# Patient Record
Sex: Female | Born: 1995 | Hispanic: No | Marital: Single | Smoking: Former smoker
Health system: Southern US, Community
[De-identification: ages and names within clinical notes are randomized; demographics above are authoritative.]

## PROBLEM LIST (undated history)

## (undated) DIAGNOSIS — T7840XA Allergy, unspecified, initial encounter: Secondary | ICD-10-CM

## (undated) DIAGNOSIS — S060XAA Concussion with loss of consciousness status unknown, initial encounter: Secondary | ICD-10-CM

## (undated) HISTORY — DX: Allergy, unspecified, initial encounter: T78.40XA

## (undated) HISTORY — PX: NO PAST SURGERIES: SHX2092

## (undated) HISTORY — DX: Concussion with loss of consciousness status unknown, initial encounter: S06.0XAA

---

## 2019-07-30 ENCOUNTER — Other Ambulatory Visit: Payer: Self-pay

## 2019-07-30 ENCOUNTER — Encounter: Payer: Self-pay | Admitting: Family Medicine

## 2019-07-30 ENCOUNTER — Ambulatory Visit (INDEPENDENT_AMBULATORY_CARE_PROVIDER_SITE_OTHER): Payer: 59 | Admitting: Family Medicine

## 2019-07-30 VITALS — BP 104/76 | HR 66 | Temp 97.9°F | Ht 64.5 in | Wt 213.2 lb

## 2019-07-30 DIAGNOSIS — G4700 Insomnia, unspecified: Secondary | ICD-10-CM | POA: Insufficient documentation

## 2019-07-30 DIAGNOSIS — F411 Generalized anxiety disorder: Secondary | ICD-10-CM | POA: Diagnosis not present

## 2019-07-30 NOTE — Patient Instructions (Addendum)
#  Insomnia - See sleep check list - Consider - black out curtains, ear plugs - ZzzQuil - for sleep aid     Sleep hygiene checklist: 1. Avoid naps during the day 2. Avoid stimulants such as caffeine and nicotine. Avoid bedtime alcohol (it can speed onset of sleep but the body's metabolism can cause awakenings). At least 2 hours before bedtime 3. All forms of exercise help ensure sound sleep - limit vigorous exercise to morning or late afternoon 4. Avoid food too close to bedtime including chocolate (which contains caffeine) 5. Soak up natural light 6. Establish regular bedtime routine. 7. Associate bed with sleep - avoid TV, computer or phone, reading while in bed. 8. Ensure pleasant, relaxing sleep environment - quiet, dark, cool room.  Good Sleep Hygiene Habits -- Got to bed and wake up within an hour of the same time every day -- Avoid bright screens (from laptop, phone, TV) within at least 30 minutes before bed. The "blue light" supresses the sleep hormone melatonin and the content may stimulate as well -- Maintain a quiet and dark sleep environment (blackout curtains, turn on a fan or white noise to block out disruptive sounds) -- Practicing relaxing activites before bed (taking a shower, reading a book, journaling, meditation app) -- To quiet a busy mind -- consider journaling before bed (jotting down reminders, worry thoughts, as well as positive things like a gratitude list)   Begin a Mindfulness/Meditation practice -- this can take a little as 3 minutes -- You can find resources in books -- Or you can download apps like  ---- Headspace App (which currently has free content called "Weathering the Storm") ---- Calm (which has a few free options)  ---- Insignt Timer ---- Stop, Breathe & Think  # With each of these Apps - you should decline the "start free trial" offer and as you search through the App should be able to access some of their free content. You can also chose to pay  for the content if you find one that works well for you.   # Many of them also offer sleep specific content which may help with insomnia   How to help anxiety - without medication.   1) Regular Exercise - walking, jogging, cycling, dancing, strength training  2)  Begin a Mindfulness/Meditation practice -- see above  3) Healthy Diet -- Avoid or decrease Caffeine -- Avoid or decrease Alcohol -- Drink plenty of water, have a balanced diet -- Avoid cigarettes and marijuana (as well as other recreational drugs)  4) Consider contacting a professional therapist   Psychologytoday.[email or web address]  Covid Testing Location options:   [place] options:  [place]  [facility] Hotline to find locations near you: [phone]  Possible Rapid Testing:  [facility] [place], [place]  Phone: [phone] *Check on cost, may be expensive  Home Kits: [email or web address] Available via [facility]. The ship the kit to you and you send it back in the envelop provided. This does take fedex mail time but should get results once received within a few days.   Covid testing is covered 100% by insurance and should also be covered 100% for high deductible plans

## 2019-07-30 NOTE — Assessment & Plan Note (Signed)
Insomnia primary complaint. Would like to do a trial of therapy and non-medication approach and return in 1-2 months to check in.

## 2019-07-30 NOTE — Progress Notes (Signed)
Subjective:     Rose Glenn is a 23 y.o. female presenting for Establish Care (was seen provider on college campus at [facility] [employer]) and Insomnia (having hard time staying asleep, x 1 month)     HPI   #Insomnia - over the last month - going to sleep ok - waking up over the night, frequently every 1-2 hours - wakes up for 15-30 minutes each - sleep is not restorative - tried melatonin w/o improvement -- did 2 weeks  - nothing she can think of that triggers waking up - stress: high - work stress - stress treatment: goes outside - no hx of anxiety or depression - does feel like she has anxiety - Caffeine: stopped coffee 2 months ago, drinking 3 cups of green tea -- does have one after lunch - endorses snoring - partner does not say she stops breathing - no falling sleep during day or while relaxing    Review of Systems  Constitutional: Negative for chills and fever.  Psychiatric/Behavioral: Positive for sleep disturbance. The patient is nervous/anxious.      Social History   Tobacco Use  Smoking Status Never Smoker  Smokeless Tobacco Never Used        Objective:    BP Readings from Last 3 Encounters:  07/30/19 104/76   Wt Readings from Last 3 Encounters:  07/30/19 213 lb 4 oz (96.7 kg)    BP 104/76   Pulse 66   Temp 97.9 F (36.6 C)   Ht 5' 4.5" (1.638 m)   Wt 213 lb 4 oz (96.7 kg)   LMP 07/03/2019   SpO2 99%   BMI 36.04 kg/m    Physical Exam Constitutional:      General: She is not in acute distress.    Appearance: She is well-developed. She is not diaphoretic.  HENT:     Right Ear: External ear normal.     Left Ear: External ear normal.     Nose: Nose normal.  Eyes:     Conjunctiva/sclera: Conjunctivae normal.  Neck:     Musculoskeletal: Neck supple.  Cardiovascular:     Rate and Rhythm: Normal rate.  Pulmonary:     Effort: Pulmonary effort is normal.  Skin:    General: Skin is warm and dry.     Capillary Refill: Capillary refill takes  less than 2 seconds.  Neurological:     Mental Status: She is alert. Mental status is at baseline.  Psychiatric:        Mood and Affect: Mood normal.        Behavior: Behavior normal.      Depression screen PHQ 2/9 07/30/2019  Decreased Interest 1  Down, Depressed, Hopeless 1  PHQ - 2 Score 2  Altered sleeping 3  Tired, decreased energy 2  Change in appetite 1  Feeling bad or failure about yourself  2  Trouble concentrating 3  Moving slowly or fidgety/restless 0  Suicidal thoughts 0  PHQ-9 Score 13  Difficult doing work/chores Somewhat difficult        Assessment & Plan:   Problem List Items Addressed This Visit      Other   Generalized anxiety disorder    Insomnia primary complaint. Would like to do a trial of therapy and non-medication approach and return in 1-2 months to check in.       Insomnia - Primary    Difficulty staying asleep. Discussed treatment of life stressors as well as trial of OTC zzzquil and  using black out curtains/ear plugs          Return in about 6 weeks (around 09/10/2019).  Lynnda Child, MD

## 2019-07-30 NOTE — Assessment & Plan Note (Signed)
Difficulty staying asleep. Discussed treatment of life stressors as well as trial of OTC zzzquil and using black out curtains/ear plugs

## 2019-07-31 ENCOUNTER — Telehealth: Payer: Self-pay | Admitting: Family Medicine

## 2019-07-31 NOTE — Telephone Encounter (Signed)
Rec'd from [employer] forwarded 3 pages to Dr. Waunita Schooner

## 2019-10-10 ENCOUNTER — Ambulatory Visit: Payer: 59 | Admitting: Family Medicine

## 2019-10-10 ENCOUNTER — Encounter: Payer: Self-pay | Admitting: Family Medicine

## 2019-10-10 ENCOUNTER — Other Ambulatory Visit: Payer: Self-pay

## 2019-10-10 ENCOUNTER — Ambulatory Visit (INDEPENDENT_AMBULATORY_CARE_PROVIDER_SITE_OTHER): Payer: No Typology Code available for payment source | Admitting: Family Medicine

## 2019-10-10 ENCOUNTER — Other Ambulatory Visit (HOSPITAL_COMMUNITY)
Admission: RE | Admit: 2019-10-10 | Discharge: 2019-10-10 | Disposition: A | Discharge status: Home / Self Care | Payer: No Typology Code available for payment source | Source: Ambulatory Visit | Attending: Family Medicine | Admitting: Family Medicine

## 2019-10-10 VITALS — BP 102/70 | HR 76 | Temp 97.3°F | Ht 65.5 in | Wt 214.8 lb

## 2019-10-10 DIAGNOSIS — Z113 Encounter for screening for infections with a predominantly sexual mode of transmission: Secondary | ICD-10-CM

## 2019-10-10 DIAGNOSIS — Z124 Encounter for screening for malignant neoplasm of cervix: Secondary | ICD-10-CM | POA: Insufficient documentation

## 2019-10-10 DIAGNOSIS — R300 Dysuria: Secondary | ICD-10-CM

## 2019-10-10 LAB — POC URINALSYSI DIPSTICK (AUTOMATED)
Bilirubin, UA: NEGATIVE
Glucose, UA: NEGATIVE
Ketones, UA: NEGATIVE
Leukocytes, UA: NEGATIVE
Nitrite, UA: NEGATIVE
Protein, UA: POSITIVE — AB
Spec Grav, UA: >=1.03 — AB (ref 1.010–1.025)
Urobilinogen, UA: 0.2 E.U./dL
pH, UA: 6 (ref 5.0–8.0)

## 2019-10-10 LAB — POCT URINALYSIS DIPSTICK OB
Glucose, UA: NEGATIVE
Leukocytes, UA: NEGATIVE
Spec Grav, UA: 1.01 (ref 1.010–1.025)
Urobilinogen, UA: 0.2 E.U./dL
pH, UA: 6 (ref 5.0–8.0)

## 2019-10-10 NOTE — Addendum Note (Signed)
Addended by: Milus Mallick on: 10/10/2019 10:13 AM   Modules accepted: Orders

## 2019-10-10 NOTE — Addendum Note (Signed)
Addended by: Aquilla Solian on: 10/10/2019 11:10 AM   Modules accepted: Orders

## 2019-10-10 NOTE — Progress Notes (Signed)
Subjective:    Patient ID: Rose Glenn, female    DOB: Feb 20, 1996, 25 y.o.   MRN: [id number]  HPI Chief Complaint  Patient presents with  . Urinary Tract Infection   This is a 24 yo female who presents today with slight discomfort in vagina. She has had recent same sex sexual activity. Three life time partners. She has previously had sex with men, most recently with women. No burning with urination. No frequency. No discharge or bleeding. She did visit her sister recently and used different soap. No previous screening for STD or pap.    Past Medical History:  Diagnosis Date  . Allergy    Past Surgical History:  Procedure Laterality Date  . NO PAST SURGERIES     Family History  Problem Relation Age of Onset  . Arthritis Mother   . Hypertension Maternal Grandmother   . Hyperlipidemia Maternal Grandmother   . Heart disease Maternal Grandmother        had surgery  . Hypertension Maternal Grandfather   . Hyperlipidemia Maternal Grandfather   . Diabetes Paternal Grandmother   . Hypertension Paternal Grandmother   . Hyperlipidemia Paternal Grandmother   . Hypertension Paternal Grandfather   . Hyperlipidemia Paternal Grandfather    Social History   Tobacco Use  . Smoking status: Never Smoker  . Smokeless tobacco: Never Used  Substance Use Topics  . Alcohol use: Yes    Comment: on the weekends, 1 drink at a time  . Drug use: Not Currently    Types: Marijuana    Comment: stopped 2019      Review of Systems Per HPI    Objective:   Physical Exam Vitals reviewed. Exam conducted with a chaperone present.  Constitutional:      General: She is not in acute distress.    Appearance: Normal appearance. She is obese. She is not ill-appearing, toxic-appearing or diaphoretic.  Cardiovascular:     Rate and Rhythm: Normal rate.  Pulmonary:     Effort: Pulmonary effort is normal.  Genitourinary:    Exam position: Supine.     Pubic Area: No rash or pubic lice.      Labia:         Right: No rash, tenderness, lesion or injury.        Left: No rash, tenderness, lesion or injury.      Urethra: No prolapse, urethral pain, urethral swelling or urethral lesion.     Vagina: Normal.     Cervix: Normal.  Skin:    General: Skin is warm and dry.  Neurological:     Mental Status: She is alert and oriented to person, place, and time.       BP 102/70   Pulse 76   Temp (!) 97.3 F (36.3 C)   Ht 5' 5.5" (1.664 m)   Wt 214 lb 12.8 oz (97.4 kg)   SpO2 99%   BMI 35.20 kg/m  Wt Readings from Last 3 Encounters:  10/10/19 214 lb 12.8 oz (97.4 kg)  07/30/19 213 lb 4 oz (96.7 kg)   Results for orders placed or performed in visit on 10/10/19  POCT Urinalysis Dipstick (Automated)  Result Value Ref Range   Color, UA Yellow    Clarity, UA Clear    Glucose, UA Negative Negative   Bilirubin, UA Negative    Ketones, UA Negative    Spec Grav, UA >=1.030 (A) 1.010 - 1.025   Blood, UA Moderate    pH, UA 6.0  5.0 - 8.0   Protein, UA Positive (A) Negative   Urobilinogen, UA 0.2 0.2 or 1.0 E.U./dL   Nitrite, UA Negative    Leukocytes, UA Negative Negative       Assessment & Plan:  1. Screening for cervical cancer - Cytology - PAP([facility])  2. Screening for STD (sexually transmitted disease) - Cytology - PAP([facility]) - RPR - HIV antibody  3. Dysuria - She is describing more perineal discomfort which may be related to new soap. No localized irritation visualized. Encouraged increased fluids.  - POCT Urinalysis Dipstick (Automated)  This visit occurred during the SARS-CoV-2 public health emergency.  Safety protocols were in place, including screening questions prior to the visit, additional usage of staff PPE, and extensive cleaning of exam room while observing appropriate contact time as indicated for disinfecting solutions.     Clarene Reamer, FNP-BC  [facility]  10/10/2019 9:47 AM

## 2019-10-10 NOTE — Patient Instructions (Signed)
Good to meet you today  I will call you with lab results. Please consider signing up for your Mychart account.   Increase fluids over the weekend.

## 2019-10-13 ENCOUNTER — Other Ambulatory Visit: Payer: Self-pay

## 2019-10-13 ENCOUNTER — Other Ambulatory Visit (INDEPENDENT_AMBULATORY_CARE_PROVIDER_SITE_OTHER): Payer: No Typology Code available for payment source

## 2019-10-13 DIAGNOSIS — Z113 Encounter for screening for infections with a predominantly sexual mode of transmission: Secondary | ICD-10-CM

## 2019-10-13 DIAGNOSIS — Z124 Encounter for screening for malignant neoplasm of cervix: Secondary | ICD-10-CM

## 2019-10-13 DIAGNOSIS — R300 Dysuria: Secondary | ICD-10-CM

## 2019-10-14 LAB — CYTOLOGY - PAP
Adequacy: ABSENT
Chlamydia: NEGATIVE
Comment: NEGATIVE
Comment: NEGATIVE
Comment: NORMAL
Diagnosis: NEGATIVE
Neisseria Gonorrhea: NEGATIVE
Trichomonas: NEGATIVE

## 2019-10-14 LAB — HIV ANTIBODY (ROUTINE TESTING W REFLEX): HIV 1&2 Ab, 4th Generation: NONREACTIVE

## 2019-10-14 LAB — RPR: RPR Ser Ql: NONREACTIVE

## 2019-10-30 ENCOUNTER — Ambulatory Visit (INDEPENDENT_AMBULATORY_CARE_PROVIDER_SITE_OTHER)
Admission: RE | Admit: 2019-10-30 | Discharge: 2019-10-30 | Disposition: A | Discharge status: Home / Self Care | Payer: No Typology Code available for payment source | Source: Ambulatory Visit | Attending: Internal Medicine | Admitting: Internal Medicine

## 2019-10-30 ENCOUNTER — Encounter: Payer: Self-pay | Admitting: Internal Medicine

## 2019-10-30 ENCOUNTER — Other Ambulatory Visit: Payer: Self-pay

## 2019-10-30 ENCOUNTER — Ambulatory Visit (INDEPENDENT_AMBULATORY_CARE_PROVIDER_SITE_OTHER): Payer: No Typology Code available for payment source | Admitting: Internal Medicine

## 2019-10-30 VITALS — BP 106/68 | HR 73 | Temp 97.7°F | Wt 214.0 lb

## 2019-10-30 DIAGNOSIS — M79641 Pain in right hand: Secondary | ICD-10-CM | POA: Diagnosis not present
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
## 2019-10-30 NOTE — Patient Instructions (Signed)
RICE Therapy for Routine Care of Injuries Many injuries can be cared for with rest, ice, compression, and elevation (RICE therapy). This includes:  Resting the injured part.  Putting ice on the injury.  Putting pressure (compression) on the injury.  Raising the injured part (elevation). Using RICE therapy can help to lessen pain and swelling. Supplies needed:  Ice.  Plastic bag.  Towel.  Elastic bandage.  Pillow or pillows to raise (elevate) your injured body part. How to care for your injury with RICE therapy Rest Limit your normal activities, and try not to use the injured part of your body. You can go back to your normal activities when your doctor says it is okay to do them and you feel okay. Ask your doctor if you should do exercises to help your injury get better. Ice Put ice on the injured area. Do not put ice on your bare skin.  Put ice in a plastic bag.  Place a towel between your skin and the bag.  Leave the ice on for 20 minutes, 2-3 times a day. Use ice on as many days as told by your doctor.  Compression Compression means putting pressure on the injured area. This can be done with an elastic bandage. If an elastic bandage has been put on your injury:  Do not wrap the bandage too tight. Wrap the bandage more loosely if part of your body away from the bandage is blue, swollen, cold, painful, or loses feeling (gets numb).  Take off the bandage and put it on again. Do this every 3-4 hours or as told by your doctor.  See your doctor if the bandage seems to make your problems worse.  Elevation Elevation means keeping the injured area raised. If you can, raise the injured area above your heart or the center of your chest. Contact a doctor if:  You keep having pain and swelling.  Your symptoms get worse. Get help right away if:  You have sudden bad pain at your injury or lower than your injury.  You have redness or more swelling around your injury.  You  have tingling or numbness at your injury or lower than your injury, and it does not go away when you take off the bandage. Summary  Many injuries can be cared for using rest, ice, compression, and elevation (RICE therapy).  You can go back to your normal activities when you feel okay and your doctor says it is okay.  Put ice on the injured area as told by your doctor.  Get help if your symptoms get worse or if you keep having pain and swelling. This information is not intended to replace advice given to you by your health care provider. Make sure you discuss any questions you have with your health care provider. Document Revised: 06/01/2017 Document Reviewed: 06/01/2017 Elsevier Patient Education  2020 Elsevier Inc.  

## 2019-12-11 ENCOUNTER — Ambulatory Visit: Payer: 59 | Attending: Family

## 2019-12-11 DIAGNOSIS — Z23 Encounter for immunization: Secondary | ICD-10-CM

## 2019-12-11 NOTE — Progress Notes (Signed)
   Covid-19 Vaccination Clinic  Name:  Rose Glenn    MRN: [id number] DOB: May 20, 1996  12/11/2019  Rose Glenn was observed post Covid-19 immunization for 15 minutes without incident. She was provided with Vaccine Information Sheet and instruction to access the V-Safe system.   Rose Glenn was instructed to call 911 with any severe reactions post vaccine: Marland Kitchen Difficulty breathing  . Swelling of face and throat  . A fast heartbeat  . A bad rash all over body  . Dizziness and weakness   Immunizations Administered    Name Date Dose VIS Date Route   Moderna COVID-19 Vaccine 12/11/2019  4:16 PM 0.5 mL 08/26/2019 Intramuscular   Manufacturer: Moderna   Lot: [id number]   NDC: 10175-102-58

## 2020-01-15 ENCOUNTER — Ambulatory Visit: Payer: 59 | Attending: Family

## 2020-01-15 DIAGNOSIS — Z23 Encounter for immunization: Secondary | ICD-10-CM

## 2020-01-15 NOTE — Progress Notes (Signed)
   Covid-19 Vaccination Clinic  Name:  Rose Glenn    MRN: [id number] DOB: 12/07/1995  01/15/2020  [name] was observed post Covid-19 immunization for 15 minutes without incident. She was provided with Vaccine Information Sheet and instruction to access the V-Safe system.   Ms. Stump was instructed to call 911 with any severe reactions post vaccine: Marland Kitchen Difficulty breathing  . Swelling of face and throat  . A fast heartbeat  . A bad rash all over body  . Dizziness and weakness   Immunizations Administered    Name Date Dose VIS Date Route   Moderna COVID-19 Vaccine 01/15/2020  3:59 PM 0.5 mL 08/2019 Intramuscular   Manufacturer: Moderna   Lot: [id number]   NDC: 65035-465-68

## 2020-02-24 ENCOUNTER — Encounter: Payer: Self-pay | Admitting: Family Medicine

## 2020-02-24 ENCOUNTER — Other Ambulatory Visit: Payer: Self-pay

## 2020-02-24 ENCOUNTER — Ambulatory Visit (INDEPENDENT_AMBULATORY_CARE_PROVIDER_SITE_OTHER): Payer: No Typology Code available for payment source | Admitting: Family Medicine

## 2020-02-24 VITALS — BP 104/70 | HR 78 | Ht 65.5 in | Wt 213.0 lb

## 2020-02-24 DIAGNOSIS — B373 Candidiasis of vulva and vagina: Secondary | ICD-10-CM

## 2020-02-24 DIAGNOSIS — B3731 Acute candidiasis of vulva and vagina: Secondary | ICD-10-CM | POA: Insufficient documentation

## 2020-02-24 LAB — POCT WET PREP (WET MOUNT): Trichomonas Wet Prep HPF POC: ABSENT

## 2020-02-24 MED ORDER — FLUCONAZOLE 150 MG PO TABS: ORAL_TABLET | ORAL | 0 refills | Status: DC

## 2020-02-24 NOTE — Progress Notes (Signed)
Chief Complaint  Patient presents with  . Vaginitis    History of Present Illness: HPI    24 year old female patient of Dr. Elmyra Glenn present with new onset vaginal discharge, itching ongoign x 6 days   She treated with monistat.. it help mitigate some of the discharge, but has not resolved completely. Now symptoms off and on. Monistat caused cramping.  no sores.   No past vaginal yeast or BV in past.  No dysuria, no D/C/ N/V, no abdominal pain.    She is sexually active.. no new partner. No recent antibiotics.  No hx of STDs.   This visit occurred during the SARS-CoV-2 public health emergency.  Safety protocols were in place, including screening questions prior to the visit, additional usage of staff PPE, and extensive cleaning of exam room while observing appropriate contact time as indicated for disinfecting solutions.   COVID 19 screen:  No recent travel or known exposure to COVID19 The patient denies respiratory symptoms of COVID 19 at this time. The importance of social distancing was discussed today.     Review of Systems  Constitutional: Negative for chills and fever.  HENT: Negative for congestion and ear pain.   Eyes: Negative for pain and redness.  Respiratory: Negative for cough and shortness of breath.   Cardiovascular: Negative for chest pain, palpitations and leg swelling.  Gastrointestinal: Negative for abdominal pain, blood in stool, constipation, diarrhea, nausea and vomiting.  Genitourinary: Negative for dysuria.  Musculoskeletal: Negative for falls and myalgias.  Skin: Negative for rash.  Neurological: Negative for dizziness.  Psychiatric/Behavioral: Negative for depression. The patient is not nervous/anxious.       Past Medical History:  Diagnosis Date  . Allergy     reports that she has never smoked. She has never used smokeless tobacco. She reports current alcohol use. She reports previous drug use. Drug: Marijuana.   Current Outpatient  Medications:  .  ADDERALL XR 20 MG 24 hr capsule, Take 20 mg by mouth at bedtime., Disp: , Rfl:  .  cetirizine (ZYRTEC) 10 MG tablet, Take 10 mg by mouth daily., Disp: , Rfl:    Observations/Objective: Blood pressure 104/70, pulse 78, height 5' 5.5" (1.664 m), weight 213 lb (96.6 kg), last menstrual period 02/01/2020, SpO2 98 %.  Physical Exam Constitutional:      General: She is not in acute distress.    Appearance: Normal appearance. She is well-developed. She is not ill-appearing or toxic-appearing.  HENT:     Head: Normocephalic.     Right Ear: Hearing, tympanic membrane, ear canal and external ear normal. Tympanic membrane is not erythematous, retracted or bulging.     Left Ear: Hearing, tympanic membrane, ear canal and external ear normal. Tympanic membrane is not erythematous, retracted or bulging.     Nose: No mucosal edema or rhinorrhea.     Right Sinus: No maxillary sinus tenderness or frontal sinus tenderness.     Left Sinus: No maxillary sinus tenderness or frontal sinus tenderness.     Mouth/Throat:     Pharynx: Uvula midline.  Eyes:     General: Lids are normal. Lids are everted, no foreign bodies appreciated.     Conjunctiva/sclera: Conjunctivae normal.     Pupils: Pupils are equal, round, and reactive to light.  Neck:     Thyroid: No thyroid mass or thyromegaly.     Vascular: No carotid bruit.     Trachea: Trachea normal.  Cardiovascular:     Rate and Rhythm: Normal  rate and regular rhythm.     Pulses: Normal pulses.     Heart sounds: Normal heart sounds, S1 normal and S2 normal. No murmur. No friction rub. No gallop.   Pulmonary:     Effort: Pulmonary effort is normal. No tachypnea or respiratory distress.     Breath sounds: Normal breath sounds. No decreased breath sounds, wheezing, rhonchi or rales.  Abdominal:     General: Bowel sounds are normal.     Palpations: Abdomen is soft.     Tenderness: There is no abdominal tenderness.  Genitourinary:    Vagina:  Vaginal discharge and erythema present.  Musculoskeletal:     Cervical back: Normal range of motion and neck supple.  Skin:    General: Skin is warm and dry.     Findings: No rash.  Neurological:     Mental Status: She is alert.  Psychiatric:        Mood and Affect: Mood is not anxious or depressed.        Speech: Speech normal.        Behavior: Behavior normal. Behavior is cooperative.        Thought Content: Thought content normal.        Judgment: Judgment normal.      Assessment and Plan   Vagina, candidiasis Treat with fluconazole x 1 dose, may repeat in 3 days if needed.     Eliezer Lofts, MD

## 2020-02-24 NOTE — Patient Instructions (Signed)
Vaginal Yeast Infection, Adult  Vaginal yeast infection is a condition that causes vaginal discharge as well as soreness, swelling, and redness (inflammation) of the vagina. This is a common condition. Some women get this infection frequently. What are the causes? This condition is caused by a change in the normal balance of the yeast (candida) and bacteria that live in the vagina. This change causes an overgrowth of yeast, which causes the inflammation. What increases the risk? The condition is more likely to develop in women who:  Take antibiotic medicines.  Have diabetes.  Take birth control pills.  Are pregnant.  Douche often.  Have a weak body defense system (immune system).  Have been taking steroid medicines for a long time.  Frequently wear tight clothing. What are the signs or symptoms? Symptoms of this condition include:  White, thick, creamy vaginal discharge.  Swelling, itching, redness, and irritation of the vagina. The lips of the vagina (vulva) may be affected as well.  Pain or a burning feeling while urinating.  Pain during sex. How is this diagnosed? This condition is diagnosed based on:  Your medical history.  A physical exam.  A pelvic exam. Your health care provider will examine a sample of your vaginal discharge under a microscope. Your health care provider may send this sample for testing to confirm the diagnosis. How is this treated? This condition is treated with medicine. Medicines may be over-the-counter or prescription. You may be told to use one or more of the following:  Medicine that is taken by mouth (orally).  Medicine that is applied as a cream (topically).  Medicine that is inserted directly into the vagina (suppository). Follow these instructions at home:  Lifestyle  Do not have sex until your health care provider approves. Tell your sex partner that you have a yeast infection. That person should go to his or her health care  provider and ask if they should also be treated.  Do not wear tight clothes, such as pantyhose or tight pants.  Wear breathable cotton underwear. General instructions  Take or apply over-the-counter and prescription medicines only as told by your health care provider.  Eat more yogurt. This may help to keep your yeast infection from returning.  Do not use tampons until your health care provider approves.  Try taking a sitz bath to help with discomfort. This is a warm water bath that is taken while you are sitting down. The water should only come up to your hips and should cover your buttocks. Do this 3-4 times per day or as told by your health care provider.  Do not douche.  If you have diabetes, keep your blood sugar levels under control.  Keep all follow-up visits as told by your health care provider. This is important. Contact a health care provider if:  You have a fever.  Your symptoms go away and then return.  Your symptoms do not get better with treatment.  Your symptoms get worse.  You have new symptoms.  You develop blisters in or around your vagina.  You have blood coming from your vagina and it is not your menstrual period.  You develop pain in your abdomen. Summary  Vaginal yeast infection is a condition that causes discharge as well as soreness, swelling, and redness (inflammation) of the vagina.  This condition is treated with medicine. Medicines may be over-the-counter or prescription.  Take or apply over-the-counter and prescription medicines only as told by your health care provider.  Do not douche.   Do not have sex or use tampons until your health care provider approves.  Contact a health care provider if your symptoms do not get better with treatment or your symptoms go away and then return. This information is not intended to replace advice given to you by your health care provider. Make sure you discuss any questions you have with your health care  provider. Document Revised: 04/11/2019 Document Reviewed: 01/28/2018 Elsevier Patient Education  2020 [employer]  

## 2020-02-24 NOTE — Assessment & Plan Note (Signed)
Treat with fluconazole x 1 dose, may repeat in 3 days if needed.

## 2020-06-27 ENCOUNTER — Other Ambulatory Visit: Payer: Self-pay

## 2020-06-27 ENCOUNTER — Encounter: Payer: Self-pay | Admitting: Gynecology

## 2020-06-27 ENCOUNTER — Ambulatory Visit
Admission: EM | Admit: 2020-06-27 | Discharge: 2020-06-27 | Disposition: A | Discharge status: Home / Self Care | Payer: No Typology Code available for payment source | Attending: Internal Medicine | Admitting: Internal Medicine

## 2020-06-27 DIAGNOSIS — Z79899 Other long term (current) drug therapy: Secondary | ICD-10-CM | POA: Insufficient documentation

## 2020-06-27 DIAGNOSIS — Z20822 Contact with and (suspected) exposure to covid-19: Secondary | ICD-10-CM | POA: Diagnosis not present

## 2020-06-27 DIAGNOSIS — J029 Acute pharyngitis, unspecified: Secondary | ICD-10-CM | POA: Insufficient documentation

## 2020-06-27 DIAGNOSIS — R519 Headache, unspecified: Secondary | ICD-10-CM | POA: Insufficient documentation

## 2020-06-27 DIAGNOSIS — F411 Generalized anxiety disorder: Secondary | ICD-10-CM | POA: Diagnosis not present

## 2020-06-27 LAB — GROUP A STREP BY PCR: Group A Strep by PCR: NOT DETECTED

## 2020-06-27 MED ORDER — IBUPROFEN 800 MG PO TABS: 800.0000 mg | ORAL_TABLET | Freq: Three times a day (TID) | ORAL | 0 refills | Status: AC | PRN

## 2020-06-27 MED ORDER — LIDOCAINE VISCOUS HCL 2 % MT SOLN: 5.0000 mL | OROMUCOSAL | 0 refills | Status: AC | PRN

## 2020-06-27 NOTE — ED Triage Notes (Signed)
Patient c/o sore throat / neck pain / headache x 3 days.Per patient painful to swallow.

## 2020-06-27 NOTE — ED Provider Notes (Signed)
[facility]    CSN: [id number] Arrival date & time: 06/27/20  1019      History   Chief Complaint Chief Complaint  Patient presents with  . Sore Throat    HPI Rose Glenn is a 24 y.o. female presenting for sore throat, neck ache, and headache for the past 3 days.  She states that she has pain with swallowing.  She admits to swollen lymph nodes in the neck.  Patient denies fever.  Admits to fatigue.  Denies chills or sweats.  She denies any cough or congestion associated with illness.  Denies severe neck or headache pain.  Denies chest pain or breathing difficulty.  She has abdominal pain, nausea, vomiting, or change in smell or taste.  No known Covid exposure and patient fully vaccinated for Covid.  She has no other complaints or concerns today.  HPI  Past Medical History:  Diagnosis Date  . Allergy     Patient Active Problem List   Diagnosis Date Noted  . Vagina, candidiasis 02/24/2020  . Generalized anxiety disorder 07/30/2019  . Insomnia 07/30/2019    Past Surgical History:  Procedure Laterality Date  . NO PAST SURGERIES      OB History   No obstetric history on file.      Home Medications    Prior to Admission medications   Medication Sig Start Date End Date Taking? Authorizing Provider  ADDERALL XR 20 MG 24 hr capsule Take 20 mg by mouth at bedtime. 01/15/20  Yes [provider]  cetirizine (ZYRTEC) 10 MG tablet Take 10 mg by mouth daily.    [provider]  fluconazole (DIFLUCAN) 150 MG tablet One tablet  po x 1, may repeat in 3 days if not resolved. 02/24/20   Bedsole, Amy E, MD  ibuprofen (ADVIL) 800 MG tablet Take 1 tablet (800 mg total) by mouth every 8 (eight) hours as needed for up to 7 days for headache or moderate pain. 06/27/20 07/04/20  Eusebio Friendly B, PA-C  lidocaine (XYLOCAINE) 2 % solution Use as directed 5 mLs in the mouth or throat every 3 (three) hours as needed for up to 7 days for mouth pain (swish and spit).  06/27/20 07/04/20  Shirlee Latch, PA-C    Family History Family History  Problem Relation Age of Onset  . Arthritis Mother   . Hypertension Maternal Grandmother   . Hyperlipidemia Maternal Grandmother   . Heart disease Maternal Grandmother        had surgery  . Hypertension Maternal Grandfather   . Hyperlipidemia Maternal Grandfather   . Diabetes Paternal Grandmother   . Hypertension Paternal Grandmother   . Hyperlipidemia Paternal Grandmother   . Hypertension Paternal Grandfather   . Hyperlipidemia Paternal Grandfather     Social History Social History   Tobacco Use  . Smoking status: Never Smoker  . Smokeless tobacco: Never Used  Vaping Use  . Vaping Use: Never used  Substance Use Topics  . Alcohol use: Yes    Comment: on the weekends, 1 drink at a time  . Drug use: Not Currently    Types: Marijuana    Comment: stopped 2019     Allergies   Other   Review of Systems Review of Systems  Constitutional: Positive for fatigue. Negative for chills, diaphoresis and fever.  HENT: Positive for sore throat and trouble swallowing. Negative for congestion, ear pain, rhinorrhea, sinus pressure and sinus pain.   Respiratory: Negative for cough and shortness of breath.  Gastrointestinal: Negative for abdominal pain, nausea and vomiting.  Musculoskeletal: Positive for neck pain. Negative for arthralgias and myalgias.  Skin: Negative for rash.  Neurological: Positive for headaches. Negative for weakness.  Hematological: Positive for adenopathy.     Physical Exam Triage Vital Signs ED Triage Vitals  Enc Vitals Group     BP 06/27/20 1102 116/73     Pulse Rate 06/27/20 1102 93     Resp 06/27/20 1102 16     Temp 06/27/20 1102 99.1 F (37.3 C)     Temp Source 06/27/20 1102 Oral     SpO2 06/27/20 1102 99 %     Weight 06/27/20 1104 217 lb (98.4 kg)     Height 06/27/20 1104 5\' 5"  (1.651 m)     Head Circumference --      Peak Flow --      Pain Score 06/27/20 1103 8      Pain Loc --      Pain Edu? --      Excl. in GC? --    No data found.  Updated Vital Signs BP 116/73 (BP Location: Left Arm)   Pulse 93   Temp 99.1 F (37.3 C) (Oral)   Resp 16   Ht 5\' 5"  (1.651 m)   Wt 217 lb (98.4 kg)   LMP 06/17/2020   SpO2 99%   BMI 36.11 kg/m      Physical Exam Vitals and nursing note reviewed.  Constitutional:      General: She is not in acute distress.    Appearance: Normal appearance. She is not ill-appearing or toxic-appearing.  HENT:     Head: Normocephalic and atraumatic.     Right Ear: Tympanic membrane, ear canal and external ear normal.     Left Ear: Tympanic membrane, ear canal and external ear normal.     Nose: Nose normal. No rhinorrhea.     Mouth/Throat:     Mouth: Mucous membranes are moist.     Pharynx: Oropharynx is clear. Posterior oropharyngeal erythema present.  Eyes:     General: No scleral icterus.       Right eye: No discharge.        Left eye: No discharge.     Conjunctiva/sclera: Conjunctivae normal.  Cardiovascular:     Rate and Rhythm: Normal rate and regular rhythm.     Heart sounds: Normal heart sounds.  Pulmonary:     Effort: Pulmonary effort is normal. No respiratory distress.     Breath sounds: Normal breath sounds.  Musculoskeletal:     Cervical back: Normal range of motion and neck supple. No rigidity or tenderness.  Lymphadenopathy:     Cervical: Cervical adenopathy present.  Skin:    General: Skin is dry.  Neurological:     General: No focal deficit present.     Mental Status: She is alert. Mental status is at baseline.     Motor: No weakness.     Gait: Gait normal.  Psychiatric:        Mood and Affect: Mood normal.        Behavior: Behavior normal.        Thought Content: Thought content normal.      UC Treatments / Results  Labs (all labs ordered are listed, but only abnormal results are displayed) Labs Reviewed  GROUP A STREP BY PCR  SARS CORONAVIRUS 2 (TAT 6-24 HRS)     EKG   Radiology No results found.  Procedures Procedures (including critical care  time)  Medications Ordered in UC Medications - No data to display  Initial Impression / Assessment and Plan / UC Course  I have reviewed the triage vital signs and the nursing notes.  Pertinent labs & imaging results that were available during my care of the patient were reviewed by me and considered in my medical decision making (see chart for details).   Rapid strep test negative.  Covid testing obtained.  CDC guidelines, isolation protocol and ED precautions discussed if Covid positive.  At this time suspect viral infection.  Treating with rest and increasing fluids.  Advised patient she can try over-the-counter Tylenol and prescription strength ibuprofen for headaches and neck ache.  Also sent Viscous Lidocaine for the sore throat.  Advised her to follow-up if she has persistent sore throat after a week or she develops a fever or any new or worsening symptoms.  Patient agreeable.  Final Clinical Impressions(s) / UC Diagnoses   Final diagnoses:  Acute pharyngitis, unspecified etiology  Acute nonintractable headache, unspecified headache type     Discharge Instructions     SORE THROAT: The treatment of sore throat depends upon the cause; strep throat is treated with an antibiotic, while viral pharyngitis is treated with rest, pain relievers. If prescribed, finish the entire course of antibiotics .Increase rest, fluids, and OTC meds as needed . If you have any questions or concerns, please call us or stop back at any time and we will be happy to help you. If your symptoms worsen, f/u with our office  or go to the ER  You have received COVID testing today either for positive exposure, concerning symptoms that could be related to COVID infection, screening purposes, or re-testing after confirmed positive.  Your test obtained today checks for active viral infection in the last 1-2 weeks. If your test  is negative now, you can still test positive later. So, if you do develop symptoms you should either get re-tested and/or isolate x 10 days. Please follow CDC guidelines.  While Rapid antigen tests come back in 15-20 minutes, send out PCR/molecular test results typically come back within 24 hours. In the mean time, if you are symptomatic, assume this could be a positive test and treat/monitor yourself as if you do have COVID.   We will call with test results. Please download the MyChart app and set up a profile to access test results.   If symptomatic, go home and rest. Push fluids. Take Tylenol as needed for discomfort. Gargle warm salt water. Throat lozenges. Take Mucinex DM or Robitussin for cough. Humidifier in bedroom to ease coughing. Warm showers. Also review the COVID handout for more information.  COVID-19 INFECTION: The incubation period of COVID-19 is approximately 14 days after exposure, with most symptoms developing in roughly 4-5 days. Symptoms may range in severity from mild to critically severe. Roughly 80% of those infected will have mild symptoms. People of any age may become infected with COVID-19 and have the ability to transmit the virus. The most common symptoms include: fever, fatigue, cough, body aches, headaches, sore throat, nasal congestion, shortness of breath, nausea, vomiting, diarrhea, changes in smell and/or taste.    COURSE OF ILLNESS Some patients may begin with mild disease which can progress quickly into critical symptoms. If your symptoms are worsening please call ahead to the Emergency Department and proceed there for further treatment. Recovery time appears to be roughly 1-2 weeks for mild symptoms and 3-6 weeks for severe disease.   GO IMMEDIATELY TO  ER FOR FEVER YOU ARE UNABLE TO GET DOWN WITH TYLENOL, BREATHING PROBLEMS, CHEST PAIN, FATIGUE, LETHARGY, INABILITY TO EAT OR DRINK, ETC  QUARANTINE AND ISOLATION: To help decrease the spread of COVID-19 please remain  isolated if you have COVID infection or are highly suspected to have COVID infection. This means -stay home and isolate to one room in the home if you live with others. Do not share a bed or bathroom with others while ill, sanitize and wipe down all countertops and keep common areas clean and disinfected. You may discontinue isolation if you have a mild case and are asymptomatic 10 days after symptom onset as long as you have been fever free >24 hours without having to take Motrin or Tylenol. If your case is more severe (meaning you develop pneumonia or are admitted in the hospital), you may have to isolate longer.   If you have been in close contact (within 6 feet) of someone diagnosed with COVID 19, you are advised to quarantine in your home for 14 days as symptoms can develop anywhere from 2-14 days after exposure to the virus. If you develop symptoms, you  must isolate.  Most current guidelines for COVID after exposure -isolate 10 days if you ARE NOT tested for COVID as long as symptoms do not develop -isolate 7 days if you are tested and remain asymptomatic -You do not necessarily need to be tested for COVID if you have + exposure and        develop   symptoms. Just isolate at home x10 days from symptom onset During this global pandemic, CDC advises to practice social distancing, try to stay at least 58ft away from others at all times. Wear a face covering. Wash and sanitize your hands regularly and avoid going anywhere that is not necessary.  KEEP IN MIND THAT THE COVID TEST IS NOT 100% ACCURATE AND YOU SHOULD STILL DO EVERYTHING TO PREVENT POTENTIAL SPREAD OF VIRUS TO OTHERS (WEAR MASK, WEAR GLOVES, WASH HANDS AND SANITIZE REGULARLY). IF INITIAL TEST IS NEGATIVE, THIS MAY NOT MEAN YOU ARE DEFINITELY NEGATIVE. MOST ACCURATE TESTING IS DONE 5-7 DAYS AFTER EXPOSURE.   It is not advised by CDC to get re-tested after receiving a positive COVID test since you can still test positive for weeks to months  after you have already cleared the virus.   *If you have not been vaccinated for COVID, I strongly suggest you consider getting vaccinated as long as there are no contraindications.      ED Prescriptions    Medication Sig Dispense Auth. Provider   lidocaine (XYLOCAINE) 2 % solution Use as directed 5 mLs in the mouth or throat every 3 (three) hours as needed for up to 7 days for mouth pain (swish and spit). 150 mL Eusebio Friendly B, PA-C   ibuprofen (ADVIL) 800 MG tablet Take 1 tablet (800 mg total) by mouth every 8 (eight) hours as needed for up to 7 days for headache or moderate pain. 21 tablet Shirlee Latch, PA-C     PDMP not reviewed this encounter.   Shirlee Latch, PA-C 06/27/20 1156

## 2020-06-27 NOTE — Discharge Instructions (Addendum)
SORE THROAT: The treatment of sore throat depends upon the cause; strep throat is treated with an antibiotic, while viral pharyngitis is treated with rest, pain relievers. If prescribed, finish the entire course of antibiotics .Increase rest, fluids, and OTC meds as needed . If you have any questions or concerns, please call us or stop back at any time and we will be happy to help you. If your symptoms worsen, f/u with our office  or go to the ER     You have received COVID testing today either for positive exposure, concerning symptoms that could be related to COVID infection, screening purposes, or re-testing after confirmed positive.  Your test obtained today checks for active viral infection in the last 1-2 weeks. If your test is negative now, you can still test positive later. So, if you do develop symptoms you should either get re-tested and/or isolate x 10 days. Please follow CDC guidelines.  While Rapid antigen tests come back in 15-20 minutes, send out PCR/molecular test results typically come back within 24 hours. In the mean time, if you are symptomatic, assume this could be a positive test and treat/monitor yourself as if you do have COVID.   We will call with test results. Please download the MyChart app and set up a profile to access test results.   If symptomatic, go home and rest. Push fluids. Take Tylenol as needed for discomfort. Gargle warm salt water. Throat lozenges. Take Mucinex DM or Robitussin for cough. Humidifier in bedroom to ease coughing. Warm showers. Also review the COVID handout for more information.  COVID-19 INFECTION: The incubation period of COVID-19 is approximately 14 days after exposure, with most symptoms developing in roughly 4-5 days. Symptoms may range in severity from mild to critically severe. Roughly 80% of those infected will have mild symptoms. People of any age may become infected with COVID-19 and have the ability to transmit the virus. The most common  symptoms include: fever, fatigue, cough, body aches, headaches, sore throat, nasal congestion, shortness of breath, nausea, vomiting, diarrhea, changes in smell and/or taste.    COURSE OF ILLNESS Some patients may begin with mild disease which can progress quickly into critical symptoms. If your symptoms are worsening please call ahead to the Emergency Department and proceed there for further treatment. Recovery time appears to be roughly 1-2 weeks for mild symptoms and 3-6 weeks for severe disease.   GO IMMEDIATELY TO ER FOR FEVER YOU ARE UNABLE TO GET DOWN WITH TYLENOL, BREATHING PROBLEMS, CHEST PAIN, FATIGUE, LETHARGY, INABILITY TO EAT OR DRINK, ETC  QUARANTINE AND ISOLATION: To help decrease the spread of COVID-19 please remain isolated if you have COVID infection or are highly suspected to have COVID infection. This means -stay home and isolate to one room in the home if you live with others. Do not share a bed or bathroom with others while ill, sanitize and wipe down all countertops and keep common areas clean and disinfected. You may discontinue isolation if you have a mild case and are asymptomatic 10 days after symptom onset as long as you have been fever free >24 hours without having to take Motrin or Tylenol. If your case is more severe (meaning you develop pneumonia or are admitted in the hospital), you may have to isolate longer.   If you have been in close contact (within 6 feet) of someone diagnosed with COVID 19, you are advised to quarantine in your home for 14 days as symptoms can develop anywhere from 2-14   days after exposure to the virus. If you develop symptoms, you  must isolate.  Most current guidelines for COVID after exposure -isolate 10 days if you ARE NOT tested for COVID as long as symptoms do not develop -isolate 7 days if you are tested and remain asymptomatic -You do not necessarily need to be tested for COVID if you have + exposure and        develop   symptoms. Just  isolate at home x10 days from symptom onset During this global pandemic, CDC advises to practice social distancing, try to stay at least 6ft away from others at all times. Wear a face covering. Wash and sanitize your hands regularly and avoid going anywhere that is not necessary.  KEEP IN MIND THAT THE COVID TEST IS NOT 100% ACCURATE AND YOU SHOULD STILL DO EVERYTHING TO PREVENT POTENTIAL SPREAD OF VIRUS TO OTHERS (WEAR MASK, WEAR GLOVES, WASH HANDS AND SANITIZE REGULARLY). IF INITIAL TEST IS NEGATIVE, THIS MAY NOT MEAN YOU ARE DEFINITELY NEGATIVE. MOST ACCURATE TESTING IS DONE 5-7 DAYS AFTER EXPOSURE.   It is not advised by CDC to get re-tested after receiving a positive COVID test since you can still test positive for weeks to months after you have already cleared the virus.   *If you have not been vaccinated for COVID, I strongly suggest you consider getting vaccinated as long as there are no contraindications.   

## 2020-06-28 ENCOUNTER — Telehealth (HOSPITAL_COMMUNITY): Payer: Self-pay | Admitting: Emergency Medicine

## 2020-06-28 LAB — SARS CORONAVIRUS 2 (TAT 6-24 HRS): SARS Coronavirus 2: NEGATIVE

## 2020-06-28 NOTE — Telephone Encounter (Signed)
Patient called to make [place] aware that he prescription for Lidocaine did not go through.  Verified receipt was not confirmed and called pharmacy to provide verbal.

## 2020-07-06 ENCOUNTER — Other Ambulatory Visit: Payer: Self-pay

## 2020-07-06 ENCOUNTER — Ambulatory Visit (INDEPENDENT_AMBULATORY_CARE_PROVIDER_SITE_OTHER): Payer: No Typology Code available for payment source | Admitting: Family Medicine

## 2020-07-06 VITALS — BP 102/80 | HR 66 | Temp 96.5°F | Ht 65.0 in | Wt 216.5 lb

## 2020-07-06 DIAGNOSIS — Z130 Encounter for screening for diseases of the blood and blood-forming organs and certain disorders involving the immune mechanism: Secondary | ICD-10-CM | POA: Diagnosis not present

## 2020-07-06 DIAGNOSIS — Z113 Encounter for screening for infections with a predominantly sexual mode of transmission: Secondary | ICD-10-CM

## 2020-07-06 DIAGNOSIS — Z136 Encounter for screening for cardiovascular disorders: Secondary | ICD-10-CM

## 2020-07-06 DIAGNOSIS — Z13228 Encounter for screening for other metabolic disorders: Secondary | ICD-10-CM | POA: Diagnosis not present

## 2020-07-06 DIAGNOSIS — Z1159 Encounter for screening for other viral diseases: Secondary | ICD-10-CM | POA: Diagnosis not present

## 2020-07-06 DIAGNOSIS — Z0000 Encounter for general adult medical examination without abnormal findings: Secondary | ICD-10-CM | POA: Diagnosis not present

## 2020-07-06 DIAGNOSIS — Z1329 Encounter for screening for other suspected endocrine disorder: Secondary | ICD-10-CM

## 2020-07-06 LAB — COMPREHENSIVE METABOLIC PANEL
ALT: 15 U/L (ref 0–35)
AST: 19 U/L (ref 0–37)
Albumin: 4 g/dL (ref 3.5–5.2)
Alkaline Phosphatase: 39 U/L (ref 39–117)
BUN: 13 mg/dL (ref 6–23)
CO2: 27 mEq/L (ref 19–32)
Calcium: 9.2 mg/dL (ref 8.4–10.5)
Chloride: 102 mEq/L (ref 96–112)
Creatinine, Ser: 0.85 mg/dL (ref 0.40–1.20)
GFR: 95.71 mL/min (ref >60.00)
Glucose, Bld: 78 mg/dL (ref 70–99)
Potassium: 4.2 mEq/L (ref 3.5–5.1)
Sodium: 137 mEq/L (ref 135–145)
Total Bilirubin: 0.4 mg/dL (ref 0.2–1.2)
Total Protein: 6.9 g/dL (ref 6.0–8.3)

## 2020-07-06 LAB — CBC
HCT: 38.3 % (ref 36.0–46.0)
Hemoglobin: 12.7 g/dL (ref 12.0–15.0)
MCHC: 33.2 g/dL (ref 30.0–36.0)
MCV: 92.2 fl (ref 78.0–100.0)
Platelets: 439 10*3/uL — ABNORMAL HIGH (ref 150.0–400.0)
RBC: 4.15 Mil/uL (ref 3.87–5.11)
RDW: 12.6 % (ref 11.5–15.5)
WBC: 5.8 10*3/uL (ref 4.0–10.5)

## 2020-07-06 LAB — LIPID PANEL
Cholesterol: 168 mg/dL (ref 0–200)
HDL: 44.1 mg/dL (ref >39.00)
LDL Cholesterol: 107 mg/dL — ABNORMAL HIGH (ref 0–99)
NonHDL: 123.52
Total CHOL/HDL Ratio: 4
Triglycerides: 82 mg/dL (ref 0.0–149.0)
VLDL: 16.4 mg/dL (ref 0.0–40.0)

## 2020-07-06 LAB — TSH: TSH: 1.58 u[IU]/mL (ref 0.35–4.50)

## 2020-07-06 NOTE — Progress Notes (Signed)
Annual Exam   Chief Complaint:  Chief Complaint  Patient presents with  . Annual Exam    wants an exam of peri area due to using nair     History of Present Illness:  [name] is a 24 y.o. No obstetric history on file. who LMP was Patient's last menstrual period was 06/17/2020., presents today for her annual examination.    Skin irritation - used Carlton Adam yesterday and left it on for too long and now having some pain  Nutrition/Lifestyle Diet: getting better - no fried foods, more greens Exercise: daily - 10-45 minutes depending on the day She does get adequate calcium and Vitamin D in her diet.  Social History   Tobacco Use  Smoking Status Never Smoker  Smokeless Tobacco Never Used   Social History   Substance and Sexual Activity  Alcohol Use Yes   Comment: on the weekends, 1 drink at a time   Social History   Substance and Sexual Activity  Drug Use Not Currently  . Types: Marijuana   Comment: stopped 2019     Safety The patient wears seatbelts: yes.     The patient feels safe at home and in their relationships: yes.  General Health Dentist in the last year: No Eye doctor: yes  Menstrual Her menses are regular every 28-30 days, Dysmenorrhea none. She does not have intermenstrual bleeding.  GYN She is has sex with males and females. Condoms with female partners Hx of STDs: none She does want STI testing today.  GC/C screening recommended for sexually active women 60 and younger   Cervical Cancer Screening (Age 79-65) Last Pap:  January 2021 Results were: no abnormalities with HPV - not done Family History of Breast Cancer: no Family History of Ovarian Cancer: no    Weight Wt Readings from Last 3 Encounters:  07/06/20 216 lb 8 oz (98.2 kg)  06/27/20 217 lb (98.4 kg)  02/24/20 213 lb (96.6 kg)   Patient has high BMI  BMI Readings from Last 1 Encounters:  07/06/20 36.03 kg/m     Chronic disease screening Blood pressure monitoring:  BP  Readings from Last 3 Encounters:  07/06/20 102/80  06/27/20 116/73  02/24/20 104/70     Lipid Monitoring: Indication for screening: age >39, obesity, diabetes, family hx, CV risk factors.  Lipid screening: Yes  No results found for: CHOL, HDL, LDLCALC, LDLDIRECT, TRIG, CHOLHDL   Diabetes Screening: age >42, overweight, family hx, PCOS, hx of gestational diabetes, at risk ethnicity, elevated blood pressure >135/80.  Diabetes Screening screening: Yes  No results found for: HGBA1C    Past Medical History:  Diagnosis Date  . Allergy     Past Surgical History:  Procedure Laterality Date  . NO PAST SURGERIES      Prior to Admission medications   Medication Sig Start Date End Date Taking? Authorizing Provider  ADDERALL XR 20 MG 24 hr capsule Take 20 mg by mouth at bedtime. 01/15/20  Yes [provider]  cetirizine (ZYRTEC) 10 MG tablet Take 10 mg by mouth as needed.    Yes [provider]    Allergies  Allergen Reactions  . Other Rash    Hamburger helper    Gynecologic History: Patient's last menstrual period was 06/17/2020.  Obstetric History: No obstetric history on file.  Social History   Socioeconomic History  . Marital status: Single    Spouse name: Not on file  . Number of children: Not on file  . Years  of education: College  . Highest education level: Not on file  Occupational History  . Not on file  Tobacco Use  . Smoking status: Never Smoker  . Smokeless tobacco: Never Used  Vaping Use  . Vaping Use: Never used  Substance and Sexual Activity  . Alcohol use: Yes    Comment: on the weekends, 1 drink at a time  . Drug use: Not Currently    Types: Marijuana    Comment: stopped 2019  . Sexual activity: Yes    Birth control/protection: Condom    Comment: both  Other Topics Concern  . 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 Difficulty of Paying Living Expenses: Not hard at all  Food Insecurity:   . Worried About Charity fundraiser in the Last Year: Not on file  . Ran Out of Food in the Last Year: Not on file  Transportation Needs:   . Lack of Transportation (Medical): Not on file  . Lack of Transportation (Non-Medical): Not on file  Physical Activity:   . Days of Exercise per Week: Not on file  . Minutes of Exercise per Session: Not on file  Stress:   . Feeling of Stress : Not on file  Social Connections:   . Frequency of Communication with Friends and Family: Not on file  . Frequency of Social Gatherings with Friends and Family: Not on file  . Attends Religious Services: Not on file  . Active Member of Clubs or Organizations: Not on file  . Attends Archivist Meetings: Not on file  . Marital Status: Not on file  Intimate Partner Violence:   . Fear of Current or Ex-Partner: Not on file  . Emotionally Abused: Not on file  . Physically Abused: Not on file  . Sexually Abused: Not on file    Family History  Problem Relation Age of Onset  . Arthritis Mother   . Hypertension Maternal Grandmother   . Hyperlipidemia Maternal Grandmother   . Heart disease Maternal Grandmother        had surgery  . Hypertension Maternal Grandfather   . Hyperlipidemia Maternal Grandfather   . Diabetes Paternal Grandmother   . Hypertension Paternal Grandmother   . Hyperlipidemia Paternal Grandmother   . Hypertension Paternal Grandfather   . Hyperlipidemia Paternal  Grandfather     Review of Systems  Constitutional: Negative for chills and fever.  HENT: Negative for congestion and sore throat.   Eyes: Negative for blurred vision and double vision.  Respiratory: Negative for shortness of breath.   Cardiovascular: Negative for chest pain.  Gastrointestinal: Negative for heartburn, nausea and vomiting.  Genitourinary: Negative.        Skin irritation  Musculoskeletal: Negative.  Negative for myalgias.  Skin: Negative for  rash.  Neurological: Negative for dizziness and headaches.  Endo/Heme/Allergies: Does not bruise/bleed easily.  Psychiatric/Behavioral: Negative for depression. The patient is not nervous/anxious.      Physical Exam BP 102/80   Pulse 66   Temp (!) 96.5 F (35.8 C) (Temporal)   Ht 5' 5"  (1.651 m)   Wt 216 lb 8 oz (98.2 kg)   LMP 06/17/2020   SpO2 97%   BMI 36.03 kg/m    BP Readings from Last 3 Encounters:  07/06/20 102/80  06/27/20 116/73  02/24/20 104/70    Wt Readings from Last 3 Encounters:  07/06/20 216 lb 8 oz (98.2 kg)  06/27/20 217 lb (98.4 kg)  02/24/20 213 lb (96.6 kg)     Physical Exam Exam conducted with a chaperone present.  Constitutional:      General: She is not in acute distress.    Appearance: She is well-developed. She is not diaphoretic.  HENT:     Head: Normocephalic and atraumatic.     Right Ear: Tympanic membrane and external ear normal.     Left Ear: Tympanic membrane and external ear normal.     Nose: Nose normal.     Mouth/Throat:     Mouth: Mucous membranes are moist.     Pharynx: Posterior oropharyngeal erythema present.  Eyes:     General: No scleral icterus.    Conjunctiva/sclera: Conjunctivae normal.  Cardiovascular:     Rate and Rhythm: Normal rate and regular rhythm.     Heart sounds: No murmur heard.   Pulmonary:     Effort: Pulmonary effort is normal. No respiratory distress.     Breath sounds: Normal breath sounds. No wheezing.  Abdominal:     General: Bowel  sounds are normal. There is no distension.     Palpations: Abdomen is soft. There is no mass.     Tenderness: There is no abdominal tenderness. There is no guarding or rebound.  Genitourinary:    Comments: Mild erythema on the labial minora Musculoskeletal:        General: Normal range of motion.     Cervical back: Neck supple.  Lymphadenopathy:     Cervical: No cervical adenopathy.  Skin:    General: Skin is warm and dry.     Capillary Refill: Capillary refill takes less than 2 seconds.  Neurological:     Mental Status: She is alert and oriented to person, place, and time.     Deep Tendon Reflexes: Reflexes normal.  Psychiatric:        Behavior: Behavior normal.       Results: Depression screen [facility] 2/9 07/06/2020 07/30/2019  Decreased Interest 0 1  Down, Depressed, Hopeless 2 1  PHQ - 2 Score 2 2  Altered sleeping 2 3  Tired, decreased energy 2 2  Change in appetite 0 1  Feeling bad or failure about yourself  1 2  Trouble concentrating 3 3  Moving slowly or fidgety/restless 0 0  Suicidal thoughts 0 0  PHQ-9 Score 10 13  Difficult doing work/chores Somewhat difficult Somewhat difficult      Assessment: 24 y.o. No obstetric history on file. female here for routine annual examination.  Plan: Problem List Items Addressed This Visit    None    Visit Diagnoses    Annual physical exam    -  Primary   Encounter for special screening examination for cardiovascular disorder       Relevant Orders   Lipid panel   Encounter for  screening for hematologic disorder       Relevant Orders   CBC   Need for hepatitis C screening test       Relevant Orders   Hepatitis C antibody   Screening for metabolic disorder       Relevant Orders   Comprehensive metabolic panel   Screening for thyroid disorder       Relevant Orders   TSH   Screening for STD (sexually transmitted disease)       Relevant Orders   HIV Antibody (routine testing w rflx)   RPR   C. trachomatis/N.  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 She feels this is well controlled at this time and will continue discussing with behavioral health and therapist  Safety -- tobacco screening: not using -- alcohol screening:  low-risk usage. -- no evidence of domestic violence or intimate partner violence. -- STD screening: gonorrhea/chlamydia NAAT (Recommended for <24 years) collected  Cancer Screening -- pap smear not collected per ASCCP guidelines -- family history of breast cancer screening: done. not at high risk.   Immunizations Immunization History  Administered Date(s) Administered  . Hepatitis B 10-02-1995, 06/25/1996, 10/07/1996  . MMR 10/21/1997, 04/01/2001  . Moderna SARS-COVID-2 Vaccination 12/11/2019, 01/15/2020  . Tdap 06/16/2008    -- flu vaccine declined -- TDAP q10 years not up to date - she will return tomorrow -- HPV vaccination series (Age <26, shared decision 27-45): unknown - records requested -- Covid-19 Vaccine up to date  Encouraged regular vision and dental screening. Encouraged healthy exercise and diet.   Lesleigh Noe

## 2020-07-06 NOTE — Patient Instructions (Signed)
Preventive Care 21-24 Years Old, Female Preventive care refers to visits with your health care provider and lifestyle choices that can promote health and wellness. This includes:  A yearly physical exam. This may also be called an annual well check.  Regular dental visits and eye exams.  Immunizations.  Screening for certain conditions.  Healthy lifestyle choices, such as eating a healthy diet, getting regular exercise, not using drugs or products that contain nicotine and tobacco, and limiting alcohol use. What can I expect for my preventive care visit? Physical exam Your health care provider will check your:  Height and weight. This may be used to calculate body mass index (BMI), which tells if you are at a healthy weight.  Heart rate and blood pressure.  Skin for abnormal spots. Counseling Your health care provider may ask you questions about your:  Alcohol, tobacco, and drug use.  Emotional well-being.  Home and relationship well-being.  Sexual activity.  Eating habits.  Work and work environment.  Method of birth control.  Menstrual cycle.  Pregnancy history. What immunizations do I need?  Influenza (flu) vaccine  This is recommended every year. Tetanus, diphtheria, and pertussis (Tdap) vaccine  You may need a Td booster every 10 years. Varicella (chickenpox) vaccine  You may need this if you have not been vaccinated. Human papillomavirus (HPV) vaccine  If recommended by your health care provider, you may need three doses over 6 months. Measles, mumps, and rubella (MMR) vaccine  You may need at least one dose of MMR. You may also need a second dose. Meningococcal conjugate (MenACWY) vaccine  One dose is recommended if you are age 19-21 years and a first-year college student living in a residence hall, or if you have one of several medical conditions. You may also need additional booster doses. Pneumococcal conjugate (PCV13) vaccine  You may need  this if you have certain conditions and were not previously vaccinated. Pneumococcal polysaccharide (PPSV23) vaccine  You may need one or two doses if you smoke cigarettes or if you have certain conditions. Hepatitis A vaccine  You may need this if you have certain conditions or if you travel or work in places where you may be exposed to hepatitis A. Hepatitis B vaccine  You may need this if you have certain conditions or if you travel or work in places where you may be exposed to hepatitis B. Haemophilus influenzae type b (Hib) vaccine  You may need this if you have certain conditions. You may receive vaccines as individual doses or as more than one vaccine together in one shot (combination vaccines). Talk with your health care provider about the risks and benefits of combination vaccines. What tests do I need?  Blood tests  Lipid and cholesterol levels. These may be checked every 5 years starting at age 20.  Hepatitis C test.  Hepatitis B test. Screening  Diabetes screening. This is done by checking your blood sugar (glucose) after you have not eaten for a while (fasting).  Sexually transmitted disease (STD) testing.  BRCA-related cancer screening. This may be done if you have a family history of breast, ovarian, tubal, or peritoneal cancers.  Pelvic exam and Pap test. This may be done every 3 years starting at age 21. Starting at age 30, this may be done every 5 years if you have a Pap test in combination with an HPV test. Talk with your health care provider about your test results, treatment options, and if necessary, the need for more tests.   Follow these instructions at home: Eating and drinking   Eat a diet that includes fresh fruits and vegetables, whole grains, lean protein, and low-fat dairy.  Take vitamin and mineral supplements as recommended by your health care provider.  Do not drink alcohol if: ? Your health care provider tells you not to drink. ? You are  pregnant, may be pregnant, or are planning to become pregnant.  If you drink alcohol: ? Limit how much you have to 0-1 drink a day. ? Be aware of how much alcohol is in your drink. In the [place] one drink equals one 12 oz bottle of beer (355 mL), one 5 oz glass of wine (148 mL), or one 1 oz glass of hard liquor (44 mL). Lifestyle  Take daily care of your teeth and gums.  Stay active. Exercise for at least 30 minutes on 5 or more days each week.  Do not use any products that contain nicotine or tobacco, such as cigarettes, e-cigarettes, and chewing tobacco. If you need help quitting, ask your health care provider.  If you are sexually active, practice safe sex. Use a condom or other form of birth control (contraception) in order to prevent pregnancy and STIs (sexually transmitted infections). If you plan to become pregnant, see your health care provider for a preconception visit. What's next?  Visit your health care provider once a year for a well check visit.  Ask your health care provider how often you should have your eyes and teeth checked.  Stay up to date on all vaccines. This information is not intended to replace advice given to you by your health care provider. Make sure you discuss any questions you have with your health care provider. Document Revised: 05/23/2018 Document Reviewed: 05/23/2018 Elsevier Patient Education  2020 [employer].

## 2020-07-06 NOTE — Addendum Note (Signed)
Addended by: Alvina Chou on: 07/06/2020 04:00 PM   Modules accepted: Orders

## 2020-07-08 ENCOUNTER — Ambulatory Visit: Payer: No Typology Code available for payment source

## 2020-07-08 ENCOUNTER — Other Ambulatory Visit: Payer: Self-pay

## 2020-07-09 LAB — HEPATITIS C ANTIBODY
Hepatitis C Ab: NONREACTIVE
SIGNAL TO CUT-OFF: 0.01 (ref <1.00)

## 2020-07-09 LAB — RPR: RPR Ser Ql: NONREACTIVE

## 2020-07-09 LAB — HIV ANTIBODY (ROUTINE TESTING W REFLEX): HIV 1&2 Ab, 4th Generation: NONREACTIVE

## 2020-07-12 NOTE — Addendum Note (Signed)
Addended by: Aquilla Solian on: 07/12/2020 03:26 PM   Modules accepted: Orders

## 2020-07-13 ENCOUNTER — Telehealth: Payer: Self-pay | Admitting: Radiology

## 2020-07-13 NOTE — Telephone Encounter (Signed)
[facility] never received last sample from 10.14.Need to repeat.
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
## 2021-01-27 ENCOUNTER — Other Ambulatory Visit: Payer: Self-pay

## 2021-01-27 ENCOUNTER — Ambulatory Visit (INDEPENDENT_AMBULATORY_CARE_PROVIDER_SITE_OTHER): Payer: No Typology Code available for payment source | Admitting: Family Medicine

## 2021-01-27 VITALS — BP 100/72 | HR 88 | Temp 96.9°F | Ht 66.0 in | Wt 215.5 lb

## 2021-01-27 DIAGNOSIS — Z1159 Encounter for screening for other viral diseases: Secondary | ICD-10-CM

## 2021-01-27 DIAGNOSIS — Z23 Encounter for immunization: Secondary | ICD-10-CM | POA: Diagnosis not present

## 2021-01-27 DIAGNOSIS — Z113 Encounter for screening for infections with a predominantly sexual mode of transmission: Secondary | ICD-10-CM

## 2021-01-27 DIAGNOSIS — Z114 Encounter for screening for human immunodeficiency virus [HIV]: Secondary | ICD-10-CM | POA: Diagnosis not present

## 2021-01-27 NOTE — Progress Notes (Signed)
   Subjective:     Rose Glenn is a 25 y.o. female presenting for STD screening (No known exposure )     HPI   # STI testing - new partner - female  - is having unprotected sex - no know exposure - no hx of STIs    Review of Systems   Social History   Tobacco Use  Smoking Status Never Smoker  Smokeless Tobacco Never Used        Objective:    BP Readings from Last 3 Encounters:  01/27/21 100/72  07/06/20 102/80  06/27/20 116/73   Wt Readings from Last 3 Encounters:  01/27/21 215 lb 8 oz (97.8 kg)  07/06/20 216 lb 8 oz (98.2 kg)  06/27/20 217 lb (98.4 kg)    BP 100/72   Pulse 88   Temp (!) 96.9 F (36.1 C) (Temporal)   Ht 5\' 6"  (1.676 m)   Wt 215 lb 8 oz (97.8 kg)   LMP 01/21/2021 (Exact Date)   SpO2 98%   BMI 34.78 kg/m    Physical Exam Constitutional:      General: She is not in acute distress.    Appearance: She is well-developed. She is not diaphoretic.  HENT:     Right Ear: External ear normal.     Left Ear: External ear normal.  Eyes:     Conjunctiva/sclera: Conjunctivae normal.  Cardiovascular:     Rate and Rhythm: Normal rate.  Pulmonary:     Effort: Pulmonary effort is normal.  Musculoskeletal:     Cervical back: Neck supple.  Skin:    General: Skin is warm and dry.     Capillary Refill: Capillary refill takes less than 2 seconds.  Neurological:     Mental Status: She is alert. Mental status is at baseline.  Psychiatric:        Mood and Affect: Mood normal.        Behavior: Behavior normal.           Assessment & Plan:   Problem List Items Addressed This Visit   None   Visit Diagnoses    Routine screening for STI (sexually transmitted infection)    -  Primary   Relevant Orders   RPR   C. trachomatis/N. gonorrhoeae RNA   Screening for HIV (human immunodeficiency virus)       Relevant Orders   HIV Antibody (routine testing w rflx)   Need for hepatitis C screening test       Relevant Orders   Hepatitis C  antibody   Need for Tdap vaccination       Relevant Orders   Tdap vaccine greater than or equal to 7yo IM     No need for pregnancy as same sex partner. Discussed testing for STI and reasoning.   She will check with mom about HPV vaccine. And return for nurse visit if she has not received  TDAP today  Return if symptoms worsen or fail to improve.  01/23/2021, MD  This visit occurred during the SARS-CoV-2 public health emergency.  Safety protocols were in place, including screening questions prior to the visit, additional usage of staff PPE, and extensive cleaning of exam room while observing appropriate contact time as indicated for disinfecting solutions.

## 2021-01-27 NOTE — Patient Instructions (Signed)
STI testing today   Make a nurse visit if you mom says you did not get the HPV vaccine or pharmacy  Eligible until age 25 for the vaccine

## 2021-01-28 LAB — C. TRACHOMATIS/N. GONORRHOEAE RNA
C. trachomatis RNA, TMA: NOT DETECTED
N. gonorrhoeae RNA, TMA: NOT DETECTED

## 2021-01-28 LAB — RPR: RPR Ser Ql: NONREACTIVE

## 2021-01-28 LAB — HIV ANTIBODY (ROUTINE TESTING W REFLEX): HIV 1&2 Ab, 4th Generation: NONREACTIVE

## 2021-01-28 LAB — HEPATITIS C ANTIBODY
Hepatitis C Ab: NONREACTIVE
SIGNAL TO CUT-OFF: 0 (ref <1.00)

## 2021-04-21 ENCOUNTER — Ambulatory Visit: Payer: No Typology Code available for payment source | Admitting: Family Medicine

## 2021-06-28 ENCOUNTER — Ambulatory Visit (INDEPENDENT_AMBULATORY_CARE_PROVIDER_SITE_OTHER): Payer: No Typology Code available for payment source | Admitting: Family Medicine

## 2021-06-28 ENCOUNTER — Other Ambulatory Visit: Payer: Self-pay

## 2021-06-28 ENCOUNTER — Encounter: Payer: Self-pay | Admitting: Family Medicine

## 2021-06-28 VITALS — BP 98/64 | HR 91 | Temp 98.1°F | Ht 66.0 in | Wt 211.4 lb

## 2021-06-28 DIAGNOSIS — R040 Epistaxis: Secondary | ICD-10-CM | POA: Diagnosis not present

## 2021-06-28 NOTE — Assessment & Plan Note (Signed)
Responding to conservative measures. Advised saline spray. No symptoms of allergies though discussed risk factor. Return if worsening

## 2021-06-28 NOTE — Patient Instructions (Addendum)
Use a saline nose spray 1-2 times a day  If allergies worsen - treat these  Dry skin - make sure you drink water, use lotion as needed

## 2021-06-28 NOTE — Progress Notes (Signed)
Subjective:     Rose Glenn is a 25 y.o. female presenting for Epistaxis (Nose bleeds at least 1x per the over the last week. )     Epistaxis     #Nose bleeds - gets them when she gets hot - if her nose gets irritated she will scratch and have bleeding - usually just the left nostril - no pain or soreness - 2 days ago would notice some blood on her face when she woke up - typically during the day - blood from the inside - no itchy or water eyes - no runny nose or congestion - not sure if dry nose - treatment: none - will keep a tissue in the nostril for a couple of minutes and it will stop - occasionally has to change the tissue   Review of Systems  HENT:  Positive for nosebleeds.     Social History   Tobacco Use  Smoking Status Never  Smokeless Tobacco Never        Objective:    BP Readings from Last 3 Encounters:  06/28/21 98/64  01/27/21 100/72  07/06/20 102/80   Wt Readings from Last 3 Encounters:  06/28/21 211 lb 6.4 oz (95.9 kg)  01/27/21 215 lb 8 oz (97.8 kg)  07/06/20 216 lb 8 oz (98.2 kg)    BP 98/64 (BP Location: Left Arm, Patient Position: Sitting, Cuff Size: Large)   Pulse 91   Temp 98.1 F (36.7 C) (Oral)   Ht 5\' 6"  (1.676 m)   Wt 211 lb 6.4 oz (95.9 kg)   SpO2 99%   BMI 34.12 kg/m    Physical Exam Constitutional:      General: She is not in acute distress.    Appearance: She is well-developed. She is not diaphoretic.  HENT:     Head: Normocephalic and atraumatic.     Right Ear: Tympanic membrane and external ear normal.     Left Ear: Tympanic membrane and external ear normal.     Nose: Mucosal edema present. No signs of injury, laceration, congestion or rhinorrhea.     Right Nostril: No epistaxis.     Left Nostril: No epistaxis.  Eyes:     Conjunctiva/sclera: Conjunctivae normal.  Cardiovascular:     Rate and Rhythm: Normal rate and regular rhythm.     Heart sounds: No murmur heard. Pulmonary:     Effort: Pulmonary  effort is normal. No respiratory distress.     Breath sounds: Normal breath sounds. No wheezing.  Musculoskeletal:     Cervical back: Neck supple.  Skin:    General: Skin is warm and dry.     Capillary Refill: Capillary refill takes less than 2 seconds.  Neurological:     Mental Status: She is alert. Mental status is at baseline.  Psychiatric:        Mood and Affect: Mood normal.        Behavior: Behavior normal.          Assessment & Plan:   Problem List Items Addressed This Visit       Other   Recurrent epistaxis - Primary    Responding to conservative measures. Advised saline spray. No symptoms of allergies though discussed risk factor. Return if worsening        Return if symptoms worsen or fail to improve.  , MD  This visit occurred during the SARS-CoV-2 public health emergency.  Safety protocols were in place, including screening questions prior to the  visit, additional usage of staff PPE, and extensive cleaning of exam room while observing appropriate contact time as indicated for disinfecting solutions.

## 2021-07-26 ENCOUNTER — Telehealth: Payer: Self-pay | Admitting: *Deleted

## 2021-07-26 NOTE — Telephone Encounter (Signed)
Noted, agree with in-person evaluation 

## 2021-07-26 NOTE — Telephone Encounter (Signed)
PLEASE NOTE: All timestamps contained within this report are represented as [place] Standard Time. CONFIDENTIALTY NOTICE: This fax transmission is intended only for the addressee. It contains information that is legally privileged, confidential or otherwise protected from use or disclosure. If you are not the intended recipient, you are strictly prohibited from reviewing, disclosing, copying using or disseminating any of this information or taking any action in reliance on or regarding this information. If you have received this fax in error, please notify us immediately by telephone so that we can arrange for its return to [place]. Phone: [phone], Toll-Free: [phone], Fax: [phone] Page: 1 of 2 Call Id: [id number] [facility] Day - Client TELEPHONE ADVICE RECORD AccessNurse Patient Name: Rose Glenn [facility] Gender: Female DOB: 02-08-1996 Age: 25 Y 4 M Return Phone Number: [phone] (Primary) Address: City/ State/ Zip: [place]  [address] Client [facility] - Client Client Site [facility] - Day Physician Gweneth Dimitri- MD Contact Type Call Who Is Calling Patient / Member / Family / Caregiver Call Type Triage / Clinical Relationship To Patient Self Return Phone Number [phone] (Primary) Chief Complaint BREATHING - shortness of breath or sounds breathless Reason for Call Symptomatic / Request for Health Information Initial Comment Has shortness of breath and trouble breathing Translation No Nurse Assessment Nurse: D'Heur Ezzard Standing, RN, Adrienne Date/Time (Eastern Time): 07/26/2021 10:01:37 AM Confirm and document reason for call. If symptomatic, describe symptoms. ---Caller states she has shortness of breath and trouble breathing . S/S started Sunday morning. It is uncomfortable to lie down or laughing. It hurts when takes a deep breath. Pain is under right breast. No fever, she does have a  slight cough. Does the patient have any new or worsening symptoms? ---Yes Will a triage be completed? ---Yes Related visit to physician within the last 2 weeks? ---No Does the PT have any chronic conditions? (i.e. diabetes, asthma, this includes High risk factors for pregnancy, etc.) ---Yes List chronic conditions. ---allergies Is the patient pregnant or possibly pregnant? (Ask all females between the ages of 5-55) ---No Is this a behavioral health or substance abuse call? ---No Guidelines Guideline Title Affirmed Question Affirmed Notes Nurse Date/Time (Eastern Time) Breathing Difficulty [1] MODERATE difficulty breathing (e.g., speaks in phrases, SOB even at rest, pulse 100-120) AND [2] NEW-onset D'Heur Ezzard Standing, RN, Hansel Starling 07/26/2021 10:04:04 AM PLEASE NOTE: All timestamps contained within this report are represented as [place] Standard Time. CONFIDENTIALTY NOTICE: This fax transmission is intended only for the addressee. It contains information that is legally privileged, confidential or otherwise protected from use or disclosure. If you are not the intended recipient, you are strictly prohibited from reviewing, disclosing, copying using or disseminating any of this information or taking any action in reliance on or regarding this information. If you have received this fax in error, please notify us immediately by telephone so that we can arrange for its return to [place]. Phone: [phone], Toll-Free: [phone], Fax: [phone] Page: 2 of 2 Call Id: [id number] Guidelines Guideline Title Affirmed Question Affirmed Notes Nurse Date/Time Lamount Cohen Time) or WORSE than normal Disp. Time Lamount Cohen Time) Disposition Final User 07/26/2021 9:59:38 AM Send to Urgent Queue Salvatore Marvel 07/26/2021 10:09:08 AM Go to ED Now Yes D'Heur Ezzard Standing, RN, Maple Hudson Disagree/Comply Comply Caller Understands Yes PreDisposition Call Doctor Care Advice Given Per Guideline GO TO ED NOW: *  Leave now. Drive carefully. CARE ADVICE given per Breathing Difficulty (Adult) guideline. Comments User: Hansel Starling, D'Heur Ezzard Standing, RN Date/Time (  Eastern Time): 07/26/2021 10:05:42 AM Caller states she was drinking this weekend. Referrals GO TO FACILITY UNDECIDED

## 2021-07-26 NOTE — Telephone Encounter (Signed)
Spoke to patient by telephone and was advised that her symptoms started Sunday after being out and about over the weekend. Patient stated that she was wheezing a little when her symptoms first started. Patient stated that she does have a slight cough and it is painful when she coughs. Patient started that she is having some trouble breathing. Patient was advised that she needs a face to face evaluation for someone to listen to her lungs. Patient was advised that she should go to an Urgent Care to be evaluated. Patient stated that she has been to the [facility] and will plan on heading there shortly.

## 2021-12-12 ENCOUNTER — Other Ambulatory Visit: Payer: Self-pay

## 2021-12-12 ENCOUNTER — Ambulatory Visit (INDEPENDENT_AMBULATORY_CARE_PROVIDER_SITE_OTHER): Payer: No Typology Code available for payment source | Admitting: Family Medicine

## 2021-12-12 ENCOUNTER — Encounter: Payer: Self-pay | Admitting: Family Medicine

## 2021-12-12 VITALS — BP 112/74 | HR 95 | Ht 66.0 in | Wt 212.8 lb

## 2021-12-12 DIAGNOSIS — L299 Pruritus, unspecified: Secondary | ICD-10-CM | POA: Insufficient documentation

## 2021-12-12 DIAGNOSIS — Z113 Encounter for screening for infections with a predominantly sexual mode of transmission: Secondary | ICD-10-CM | POA: Diagnosis not present

## 2021-12-12 NOTE — Assessment & Plan Note (Signed)
Exam reassuring, if not improving could do a trial of topical hydrocortisone 2.5% ointment.  ?

## 2021-12-12 NOTE — Patient Instructions (Addendum)
HPV Vaccine ?- would recommend getting the vaccine if you haven't yet received them  ?- you can schedule a nurse visit if you want to start ? ?STI testing today ? ?Ear itching ?- can try drops if persisting ?

## 2021-12-12 NOTE — Progress Notes (Signed)
? ?Subjective:  ? ?  ?Rose Glenn is a 26 y.o. female presenting for Ear Problem (Itching sensation in the right ear. Ongoing for several weeks /) and Exposure to STD (Partner in September mentioned the possibility of some sort of STI/STD Patient would like testing for everything. Patient declines any current symptoms ) ?  ? ? ?Exposure to STD  ? ? ?#STI exposure ?- has a new partner ?- possible exposure in September ?- no currently having symptoms ?- no hx of STI in the past ?- not sure if she every received HPV vaccine  ? ?#ear itching ?- intermittent ?- several weeks ?- a few times a week ?- keeps rubbing the ear for some relief ?- no drainage ?- no pain ?- no dry eyes, watery eyes ?- no allergy symptoms ?- no hx of ear issues in the past ? ?#low motivation ?- [employer] working ?- for 2-3 months ?- seeing therapist and discussing with them ?- had to cancel some appointments ?-  ? ?Review of Systems ? ? ?Social History  ? ?Tobacco Use  ?Smoking Status Never  ?Smokeless Tobacco Never  ? ? ? ?   ?Objective:  ?  ?BP Readings from Last 3 Encounters:  ?12/12/21 112/74  ?06/28/21 98/64  ?01/27/21 100/72  ? ?Wt Readings from Last 3 Encounters:  ?12/12/21 212 lb 12.8 oz (96.5 kg)  ?06/28/21 211 lb 6.4 oz (95.9 kg)  ?01/27/21 215 lb 8 oz (97.8 kg)  ? ? ?BP 112/74 (BP Location: Left Arm, Patient Position: Sitting, Cuff Size: Large)   Pulse 95   Ht 5\' 6"  (1.676 m)   Wt 212 lb 12.8 oz (96.5 kg)   LMP 11/19/2021 (Exact Date)   SpO2 98%   BMI 34.35 kg/m?  ? ? ?Physical Exam ?Constitutional:   ?   General: She is not in acute distress. ?   Appearance: She is well-developed. She is not diaphoretic.  ?HENT:  ?   Head: Normocephalic and atraumatic.  ?   Right Ear: Tympanic membrane, ear canal and external ear normal.  ?   Left Ear: Tympanic membrane, ear canal and external ear normal.  ?   Mouth/Throat:  ?   Mouth: Mucous membranes are moist.  ?Eyes:  ?   Conjunctiva/sclera: Conjunctivae normal.  ?Cardiovascular:  ?    Rate and Rhythm: Normal rate and regular rhythm.  ?   Heart sounds: No murmur heard. ?Pulmonary:  ?   Effort: Pulmonary effort is normal. No respiratory distress.  ?   Breath sounds: Normal breath sounds. No wheezing.  ?Musculoskeletal:  ?   Cervical back: Neck supple.  ?Skin: ?   General: Skin is warm and dry.  ?   Capillary Refill: Capillary refill takes less than 2 seconds.  ?Neurological:  ?   Mental Status: She is alert. Mental status is at baseline.  ?Psychiatric:     ?   Mood and Affect: Mood normal.     ?   Behavior: Behavior normal.  ? ? ? ? ? ?   ?Assessment & Plan:  ? ?Problem List Items Addressed This Visit   ? ?  ? Other  ? Ear itching  ?  Exam reassuring, if not improving could do a trial of topical hydrocortisone 2.5% ointment.  ?  ?  ? ?Other Visit Diagnoses   ? ? Routine screening for STI (sexually transmitted infection)    -  Primary  ? Relevant Orders  ? C. trachomatis/N. gonorrhoeae RNA  ? HIV Antibody (  routine testing w rflx)  ? RPR  ? Hepatitis C antibody  ? HSV 1/2 Ab (IgM), IFA w/rflx Titer  ? ?  ? ?Pt notes possible exposure in September. With new partner ? ?Encouraged HPV vaccine ? ?Return if symptoms worsen or fail to improve. ? ?Lynnda Child, MD ? ?This visit occurred during the SARS-CoV-2 public health emergency.  Safety protocols were in place, including screening questions prior to the visit, additional usage of staff PPE, and extensive cleaning of exam room while observing appropriate contact time as indicated for disinfecting solutions.  ? ?

## 2021-12-15 LAB — HEPATITIS C ANTIBODY
Hepatitis C Ab: NONREACTIVE
SIGNAL TO CUT-OFF: 0.05 (ref <1.00)

## 2021-12-15 LAB — HSV 1/2 AB (IGM), IFA W/RFLX TITER
HSV 1 IgM Screen: NEGATIVE
HSV 2 IgM Screen: NEGATIVE

## 2021-12-15 LAB — RPR: RPR Ser Ql: NONREACTIVE

## 2021-12-15 LAB — HIV ANTIBODY (ROUTINE TESTING W REFLEX): HIV 1&2 Ab, 4th Generation: NONREACTIVE

## 2021-12-15 LAB — C. TRACHOMATIS/N. GONORRHOEAE RNA
C. trachomatis RNA, TMA: NOT DETECTED
N. gonorrhoeae RNA, TMA: NOT DETECTED

## 2022-02-07 ENCOUNTER — Encounter: Payer: Self-pay | Admitting: Family Medicine

## 2022-02-07 ENCOUNTER — Ambulatory Visit (INDEPENDENT_AMBULATORY_CARE_PROVIDER_SITE_OTHER): Payer: No Typology Code available for payment source | Admitting: Family Medicine

## 2022-02-07 VITALS — BP 112/74 | HR 85 | Temp 97.2°F | Ht 66.0 in | Wt 209.0 lb

## 2022-02-07 DIAGNOSIS — J069 Acute upper respiratory infection, unspecified: Secondary | ICD-10-CM

## 2022-02-07 MED ORDER — PROMETHAZINE-DM 6.25-15 MG/5ML PO SYRP: 5.0000 mL | ORAL_SOLUTION | Freq: Four times a day (QID) | ORAL | 0 refills | Status: AC | PRN

## 2022-02-07 NOTE — Patient Instructions (Signed)
Cough ?- try cough syrup at night, may make sleepy ?- take flonase and allergy pill too ? ?If cough does not go away by mid June - return ? ?If new symptoms - fevers, breathing difficulty, face pain/sinus congestion - return  ?

## 2022-02-07 NOTE — Progress Notes (Signed)
? ?Subjective:  ? ?  ?Rose Glenn is a 26 y.o. female presenting for Cough (Took musinex and robitussin but has not really helped. Has had cough and runny nose since may 6th. Patient has taken covid test since being sick and was negative. ) ?  ? ? ?Cough ?This is a new problem. The current episode started 1 to 4 weeks ago. The problem occurs every few hours. The cough is Productive of sputum and productive of purulent sputum. Associated symptoms include nasal congestion and shortness of breath. Pertinent negatives include no chills, fever, headaches, postnasal drip or rhinorrhea. She has tried OTC cough suppressant (allergy medication, flonase) for the symptoms.  ? ?By midday is feeling better but worse at night and in the morning ?Cough is keeping her up ?Has not tried saline rinse this for this cold ?Some symptoms have improved and others ? ?No known exposure but did go to a graduation ? ?Review of Systems  ?Constitutional:  Negative for chills and fever.  ?HENT:  Negative for postnasal drip and rhinorrhea.   ?Respiratory:  Positive for cough and shortness of breath.   ?Neurological:  Negative for headaches.  ? ? ?Social History  ? ?Tobacco Use  ?Smoking Status Never  ?Smokeless Tobacco Never  ? ? ? ?   ?Objective:  ?  ?BP Readings from Last 3 Encounters:  ?02/07/22 112/74  ?12/12/21 112/74  ?06/28/21 98/64  ? ?Wt Readings from Last 3 Encounters:  ?02/07/22 209 lb (94.8 kg)  ?12/12/21 212 lb 12.8 oz (96.5 kg)  ?06/28/21 211 lb 6.4 oz (95.9 kg)  ? ? ?BP 112/74 (BP Location: Left Arm, Patient Position: Sitting, Cuff Size: Normal)   Pulse 85   Temp (!) 97.2 ?F (36.2 ?C) (Temporal)   Ht 5\' 6"  (1.676 m)   Wt 209 lb (94.8 kg)   SpO2 99%   BMI 33.73 kg/m?  ? ? ?Physical Exam ?Constitutional:   ?   General: She is not in acute distress. ?   Appearance: She is well-developed. She is not diaphoretic.  ?HENT:  ?   Head: Normocephalic and atraumatic.  ?   Right Ear: Tympanic membrane and ear canal normal.  ?   Left  Ear: Tympanic membrane and ear canal normal.  ?   Nose: Mucosal edema and rhinorrhea present.  ?   Right Sinus: No maxillary sinus tenderness or frontal sinus tenderness.  ?   Left Sinus: No maxillary sinus tenderness or frontal sinus tenderness.  ?   Mouth/Throat:  ?   Pharynx: Uvula midline. Posterior oropharyngeal erythema present. No oropharyngeal exudate.  ?   Tonsils: 0 on the right. 0 on the left.  ?Eyes:  ?   General: No scleral icterus. ?   Conjunctiva/sclera: Conjunctivae normal.  ?Cardiovascular:  ?   Rate and Rhythm: Normal rate and regular rhythm.  ?   Heart sounds: Normal heart sounds. No murmur heard. ?Pulmonary:  ?   Effort: Pulmonary effort is normal. No respiratory distress.  ?   Breath sounds: Normal breath sounds.  ?Musculoskeletal:  ?   Cervical back: Neck supple.  ?Lymphadenopathy:  ?   Cervical: No cervical adenopathy.  ?Skin: ?   General: Skin is warm and dry.  ?   Capillary Refill: Capillary refill takes less than 2 seconds.  ?Neurological:  ?   Mental Status: She is alert.  ? ? ? ? ? ?   ?Assessment & Plan:  ? ?Problem List Items Addressed This Visit   ?None ?Visit Diagnoses   ? ?  Viral URI with cough    -  Primary  ? Relevant Medications  ? promethazine-dextromethorphan (PROMETHAZINE-DM) 6.25-15 MG/5ML syrup  ? ?  ? ?No signs or symptoms of a sinus infection.  Overall is improving mostly at night.  Discussed nighttime cough syrup.  Return if cough does not resolve within 6 weeks ? ?Return if symptoms worsen or fail to improve. ? ?Lynnda Child, MD ? ? ? ?

## 2022-03-08 ENCOUNTER — Ambulatory Visit (INDEPENDENT_AMBULATORY_CARE_PROVIDER_SITE_OTHER): Payer: No Typology Code available for payment source | Admitting: Nurse Practitioner

## 2022-03-08 ENCOUNTER — Encounter: Payer: Self-pay | Admitting: Nurse Practitioner

## 2022-03-08 VITALS — BP 122/80 | HR 82 | Temp 97.5°F | Wt 211.0 lb

## 2022-03-08 DIAGNOSIS — J069 Acute upper respiratory infection, unspecified: Secondary | ICD-10-CM | POA: Diagnosis not present

## 2022-03-08 LAB — POCT RAPID STREP A (OFFICE): Rapid Strep A Screen: NEGATIVE

## 2022-03-08 MED ORDER — PREDNISONE 20 MG PO TABS: 20.0000 mg | ORAL_TABLET | Freq: Every day | ORAL | 0 refills | Status: AC

## 2022-03-08 MED ORDER — ALBUTEROL SULFATE HFA 108 (90 BASE) MCG/ACT IN AERS: 2.0000 | INHALATION_SPRAY | Freq: Four times a day (QID) | RESPIRATORY_TRACT | 0 refills | Status: AC | PRN

## 2022-03-08 NOTE — Patient Instructions (Addendum)
It was great to see you!  Start flonase nasal spray once a day. Start prednisone 1 tablet daily in the morning with breakfast for 5 days. You can alternate ibuprofen and acetaminophen to help with your headache and sore throat. You can also gargle with warm salt water. Continue robitussin dm as needed. You can also use the albuterol inhaler as needed for chest tightness and shortness of breath.   Let's follow-up if your symptoms worsen or don't improve  Take care,  Rodman Pickle, NP

## 2022-03-08 NOTE — Assessment & Plan Note (Signed)
Symptoms are consistent with a URI.  In office strep test was negative.  With her chest tightness and intermittent shortness of breath we will start prednisone 20 mg daily for 5 days and albuterol inhaler as needed.  She can continue taking her Robitussin, alternate Tylenol and ibuprofen, drink plenty of fluids.  Home COVID test was negative.  Follow-up if symptoms do not improve or worsen.

## 2022-03-08 NOTE — Progress Notes (Signed)
Acute Office Visit  Subjective:     Patient ID: Rose Glenn, female    DOB: June 29, 1996, 26 y.o.   MRN: [id number]  Chief Complaint  Patient presents with   URI    Pt c/o headache, nasal pressure/congestion, cough, nausea/vomiting and sore throat x2 days. Pt took At-home covid test 6/12 and 6/13 both were neg    HPI Patient is in today for headache, sore throat, and coughing for the past 2 days. She went to an event this past weekend where several people were sick.   UPPER RESPIRATORY TRACT INFECTION  Fever: no Cough: yes Shortness of breath: yes intermittent Wheezing: no Chest pain: no Chest tightness: yes Chest congestion: no Nasal congestion: yes Runny nose: no Post nasal drip: yes Sneezing: no Sore throat: yes Swollen glands: no Sinus pressure: yes Headache: yes Face pain: no Toothache: no Ear pain: no bilateral Ear pressure: no bilateral Eyes red/itching:no Eye drainage/crusting: no  Vomiting: yes Rash: no Fatigue: yes Sick contacts: yes Strep contacts: no  Context: fluctuating Recurrent sinusitis: no Relief with OTC cold/cough medications: no  Treatments attempted: water, robitussin dm, tylenol   ROS See pertinent positives and negatives per HPI.     Objective:    BP 122/80   Pulse 82   Temp (!) 97.5 F (36.4 C) (Temporal)   Wt 211 lb (95.7 kg)   SpO2 99%   BMI 34.06 kg/m    Physical Exam Vitals and nursing note reviewed.  Constitutional:      General: She is not in acute distress.    Appearance: Normal appearance.  HENT:     Head: Normocephalic.     Right Ear: Tympanic membrane, ear canal and external ear normal.     Left Ear: Tympanic membrane, ear canal and external ear normal.     Mouth/Throat:     Pharynx: Posterior oropharyngeal erythema present. No oropharyngeal exudate.  Eyes:     Conjunctiva/sclera: Conjunctivae normal.  Cardiovascular:     Rate and Rhythm: Normal rate and regular rhythm.     Pulses: Normal pulses.      Heart sounds: Normal heart sounds.  Pulmonary:     Effort: Pulmonary effort is normal.     Breath sounds: Normal breath sounds.  Musculoskeletal:     Cervical back: Normal range of motion and neck supple. Tenderness present.  Lymphadenopathy:     Cervical: No cervical adenopathy.  Skin:    General: Skin is warm.  Neurological:     General: No focal deficit present.     Mental Status: She is alert and oriented to person, place, and time.  Psychiatric:        Mood and Affect: Mood normal.        Behavior: Behavior normal.        Thought Content: Thought content normal.        Judgment: Judgment normal.       Assessment & Plan:   Problem List Items Addressed This Visit       Respiratory   Upper respiratory tract infection - Primary    Symptoms are consistent with a URI.  In office strep test was negative.  With her chest tightness and intermittent shortness of breath we will start prednisone 20 mg daily for 5 days and albuterol inhaler as needed.  She can continue taking her Robitussin, alternate Tylenol and ibuprofen, drink plenty of fluids.  Home COVID test was negative.  Follow-up if symptoms do not improve or worsen.  Relevant Orders   POCT rapid strep A    Meds ordered this encounter  Medications   albuterol (VENTOLIN HFA) 108 (90 Base) MCG/ACT inhaler    Sig: Inhale 2 puffs into the lungs every 6 (six) hours as needed for wheezing or shortness of breath.    Dispense:  8 g    Refill:  0   predniSONE (DELTASONE) 20 MG tablet    Sig: Take 1 tablet (20 mg total) by mouth daily with breakfast.    Dispense:  5 tablet    Refill:  0    Return if symptoms worsen or fail to improve.  Gerre Scull, NP

## 2022-11-17 ENCOUNTER — Ambulatory Visit (INDEPENDENT_AMBULATORY_CARE_PROVIDER_SITE_OTHER): Payer: No Typology Code available for payment source | Admitting: Nurse Practitioner

## 2022-11-17 ENCOUNTER — Other Ambulatory Visit (HOSPITAL_COMMUNITY)
Admission: RE | Admit: 2022-11-17 | Discharge: 2022-11-17 | Disposition: A | Discharge status: Home / Self Care | Payer: No Typology Code available for payment source | Source: Ambulatory Visit | Attending: Nurse Practitioner | Admitting: Nurse Practitioner

## 2022-11-17 ENCOUNTER — Encounter: Payer: Self-pay | Admitting: Nurse Practitioner

## 2022-11-17 VITALS — BP 112/64 | HR 86 | Temp 97.5°F | Resp 16 | Ht 66.0 in | Wt 212.1 lb

## 2022-11-17 DIAGNOSIS — F411 Generalized anxiety disorder: Secondary | ICD-10-CM

## 2022-11-17 DIAGNOSIS — Z124 Encounter for screening for malignant neoplasm of cervix: Secondary | ICD-10-CM | POA: Diagnosis present

## 2022-11-17 DIAGNOSIS — E669 Obesity, unspecified: Secondary | ICD-10-CM | POA: Insufficient documentation

## 2022-11-17 DIAGNOSIS — G4709 Other insomnia: Secondary | ICD-10-CM | POA: Diagnosis not present

## 2022-11-17 DIAGNOSIS — Z0000 Encounter for general adult medical examination without abnormal findings: Secondary | ICD-10-CM | POA: Diagnosis not present

## 2022-11-17 LAB — CBC
HCT: 40.4 % (ref 36.0–46.0)
Hemoglobin: 13.6 g/dL (ref 12.0–15.0)
MCHC: 33.7 g/dL (ref 30.0–36.0)
MCV: 90.2 fl (ref 78.0–100.0)
Platelets: 436 10*3/uL — ABNORMAL HIGH (ref 150.0–400.0)
RBC: 4.47 Mil/uL (ref 3.87–5.11)
RDW: 12.9 % (ref 11.5–15.5)
WBC: 7.2 10*3/uL (ref 4.0–10.5)

## 2022-11-17 LAB — TSH: TSH: 0.87 u[IU]/mL (ref 0.35–5.50)

## 2022-11-17 LAB — COMPREHENSIVE METABOLIC PANEL
ALT: 11 U/L (ref 0–35)
AST: 10 U/L (ref 0–37)
Albumin: 4.5 g/dL (ref 3.5–5.2)
Alkaline Phosphatase: 46 U/L (ref 39–117)
BUN: 15 mg/dL (ref 6–23)
CO2: 23 mEq/L (ref 19–32)
Calcium: 9.8 mg/dL (ref 8.4–10.5)
Chloride: 105 mEq/L (ref 96–112)
Creatinine, Ser: 0.89 mg/dL (ref 0.40–1.20)
GFR: 89.34 mL/min (ref >60.00)
Glucose, Bld: 82 mg/dL (ref 70–99)
Potassium: 4 mEq/L (ref 3.5–5.1)
Sodium: 137 mEq/L (ref 135–145)
Total Bilirubin: 0.8 mg/dL (ref 0.2–1.2)
Total Protein: 7.4 g/dL (ref 6.0–8.3)

## 2022-11-17 LAB — HEMOGLOBIN A1C: Hgb A1c MFr Bld: 5 % (ref 4.6–6.5)

## 2022-11-17 LAB — LIPID PANEL
Cholesterol: 184 mg/dL (ref 0–200)
HDL: 47.6 mg/dL (ref >39.00)
LDL Cholesterol: 119 mg/dL — ABNORMAL HIGH (ref 0–99)
NonHDL: 135.9
Total CHOL/HDL Ratio: 4
Triglycerides: 86 mg/dL (ref 0.0–149.0)
VLDL: 17.2 mg/dL (ref 0.0–40.0)

## 2022-11-17 NOTE — Assessment & Plan Note (Signed)
Has been a longstanding issue for patient.  No difficulty falling asleep but has restless sleep with awakenings.  Check labs inclusive of TSH

## 2022-11-17 NOTE — Progress Notes (Signed)
Established Patient Office Visit  Subjective   Patient ID: Rose Glenn, female    DOB: 1996-08-14  Age: 27 y.o. MRN: [id number]  Chief Complaint  Patient presents with   Establish Care    Transferring from Dr. Einar Pheasant    HPI  Transfer of care: last seen by Waunita Schooner, MD on 02/07/2022 Last CPE:?  for complete physical and follow up of chronic conditions.  ADD: was on adderall. States that she was switched and to vyvanse and did not do well   Anxiety: hydroxyzine as needed for anxiety. States that she is using it 2-3 times a week. Having some trouble sleeping. States that she will sleep but then wake up. States that she feels like a restless sleep. Does snore   Immunizations: -Tetanus: Completed in 2022 -Influenza: refused -Shingles: Too young -Pneumonia: Too young -Covid: original 2 series   -HPV: unsure, patient was not born in [place] but never higher unable to access childhood shot records to be in CR.  Diet: Fair diet. States that she eats approx 2 meals and some snacks. Water is the main fluid. Has been drinking liquid IV energy version. Zip fizz. Some coffee  Exercise: No regular exercise. States that she has been walking or gym for 30 mins- 1 hour 2 days a week   Eye exam:  needs updating wears glasses Dental exam: Completes semi-annually   Pap Smear: Completed in 2021, normal patient is due today Mammogram: Too young currently average risk  Colonoscopy: Too young, currently average risk Lung Cancer Screening: Does not qualify Dexa: N/A     11/17/2022    8:48 AM 12/12/2021   12:45 PM 07/06/2020    8:49 AM 07/30/2019   11:26 AM  Depression screen PHQ 2/9  Decreased Interest 2 2 0 1  Down, Depressed, Hopeless '2 2 2 1  '$ PHQ - 2 Score '4 4 2 2  '$ Altered sleeping '2 2 2 3  '$ Tired, decreased energy '2 2 2 2  '$ Change in appetite 1 2 0 1  Feeling bad or failure about yourself  '1 2 1 2  '$ Trouble concentrating '3 2 3 3  '$ Moving slowly or fidgety/restless 0 0 0 0   Suicidal thoughts 0 0 0 0  PHQ-9 Score '13 14 10 13  '$ Difficult doing work/chores Very difficult Very difficult Somewhat difficult Somewhat difficult       11/17/2022    8:48 AM 12/12/2021   12:46 PM 07/06/2020    8:49 AM  GAD 7 : Generalized Anxiety Score  Nervous, Anxious, on Edge '2 2 2  '$ Control/stop worrying '1 1 2  '$ Worry too much - different things '1 1 3  '$ Trouble relaxing '2 2 2  '$ Restless 0 0 0  Easily annoyed or irritable '1 1 2  '$ Afraid - awful might happen '1 1 2  '$ Total GAD 7 Score '8 8 13  '$ Anxiety Difficulty Somewhat difficult Very difficult Somewhat difficult          Review of Systems  Constitutional:  Negative for chills and fever.  Respiratory:  Negative for shortness of breath.   Cardiovascular:  Negative for chest pain and leg swelling.  Gastrointestinal:  Negative for abdominal pain, blood in stool, constipation, diarrhea, nausea and vomiting.       Bm daily   Genitourinary:  Negative for dysuria and hematuria.  Neurological:  Negative for tingling and headaches.  Psychiatric/Behavioral:  Negative for hallucinations and suicidal ideas.       Objective:  BP 112/64   Pulse 86   Temp (!) 97.5 F (36.4 C)   Resp 16   Ht '5\' 6"'$  (1.676 m)   Wt 212 lb 2 oz (96.2 kg)   LMP 10/16/2022   SpO2 98%   BMI 34.24 kg/m    Physical Exam Vitals and nursing note reviewed. Exam conducted with a chaperone present (kelly Fiscal, CMA).  Constitutional:      Appearance: Normal appearance.  HENT:     Right Ear: Tympanic membrane, ear canal and external ear normal.     Left Ear: Tympanic membrane, ear canal and external ear normal.     Mouth/Throat:     Mouth: Mucous membranes are moist.     Pharynx: Oropharynx is clear.  Eyes:     Extraocular Movements: Extraocular movements intact.     Pupils: Pupils are equal, round, and reactive to light.  Cardiovascular:     Rate and Rhythm: Normal rate and regular rhythm.     Pulses: Normal pulses.     Heart sounds: Normal  heart sounds.  Pulmonary:     Effort: Pulmonary effort is normal.     Breath sounds: Normal breath sounds.  Abdominal:     General: Bowel sounds are normal. There is no distension.     Palpations: There is no mass.     Tenderness: There is no abdominal tenderness.     Hernia: No hernia is present.  Genitourinary:    Vagina: Normal.     Cervix: Normal.     Uterus: Normal.      Adnexa: Right adnexa normal and left adnexa normal.  Musculoskeletal:     Right lower leg: No edema.     Left lower leg: No edema.  Lymphadenopathy:     Cervical: No cervical adenopathy.  Skin:    General: Skin is warm.  Neurological:     General: No focal deficit present.     Mental Status: She is alert.     Deep Tendon Reflexes:     Reflex Scores:      Bicep reflexes are 1+ on the right side and 1+ on the left side.      Patellar reflexes are 1+ on the right side and 1+ on the left side.    Comments: Bilateral upper and lower extremity strength 5/5  Psychiatric:        Mood and Affect: Mood normal.        Behavior: Behavior normal.        Thought Content: Thought content normal.        Judgment: Judgment normal.      No results found for any visits on 11/17/22.    The ASCVD Risk score (Arnett DK, et al., 2019) failed to calculate for the following reasons:   The 2019 ASCVD risk score is only valid for ages 24 to 71    Assessment & Plan:   Problem List Items Addressed This Visit       Other   Generalized anxiety disorder    Patient uses hydroxyzine as needed approximately 2-3 times a week.  PHQ-9 and GAD-7 administered in office.  Patient denies HI/SI/AVH.      Relevant Orders   TSH   Insomnia    Has been a longstanding issue for patient.  No difficulty falling asleep but has restless sleep with awakenings.  Check labs inclusive of TSH      Relevant Orders   TSH   Obesity (BMI 30-39.9)  Patient is working on healthy lifestyle modifications inclusive of exercise continue pending  labs inclusive of lipids and A1c today      Relevant Orders   Hemoglobin A1c   TSH   Lipid panel   Preventative health care - Primary    Age-appropriate immunizations and screening exams.  Patient is up-to-date on her tetanus shot, refused flu vaccine, has gotten original COVID vaccines.  Patient is too young for CRC screening, breast cancer screening.  We did update patient's cervical cancer screening today with a Pap smear patient with did want STI testing.  Patient was given information at discharge about preventative healthcare maintenance with anticipatory guidance for age range.      Relevant Orders   CBC   Comprehensive metabolic panel   Hemoglobin A1c   TSH   Lipid panel   Other Visit Diagnoses     Cervical cancer screening       Relevant Orders   Cytology - PAP([facility])       Return in about 1 year (around 11/18/2023) for CPE and Labs.    Romilda Garret, NP

## 2022-11-17 NOTE — Assessment & Plan Note (Signed)
Age-appropriate immunizations and screening exams.  Patient is up-to-date on her tetanus shot, refused flu vaccine, has gotten original COVID vaccines.  Patient is too young for CRC screening, breast cancer screening.  We did update patient's cervical cancer screening today with a Pap smear patient with did want STI testing.  Patient was given information at discharge about preventative healthcare maintenance with anticipatory guidance for age range.

## 2022-11-17 NOTE — Assessment & Plan Note (Signed)
Patient is working on healthy lifestyle modifications inclusive of exercise continue pending labs inclusive of lipids and A1c today

## 2022-11-17 NOTE — Assessment & Plan Note (Signed)
Patient uses hydroxyzine as needed approximately 2-3 times a week.  PHQ-9 and GAD-7 administered in office.  Patient denies HI/SI/AVH.

## 2022-11-17 NOTE — Patient Instructions (Signed)
Nice to see you today I will be in touch with the labs once I have them The pap smear may take up to two weeks to result I want to see you in 1 year for your next physical, sooner if you need me

## 2022-11-21 ENCOUNTER — Telehealth: Payer: Self-pay | Admitting: Nurse Practitioner

## 2022-11-21 NOTE — Telephone Encounter (Signed)
Able to use the specimen.It has been labeled and sent back.

## 2022-11-21 NOTE — Telephone Encounter (Signed)
Can we call and let patient know there was an issue with the labeling of her sample and that the pap smear will not be resulted. She will need to make an appointment to have it redone and recollected

## 2022-11-23 LAB — CYTOLOGY - PAP
Chlamydia: NEGATIVE
Comment: NEGATIVE
Comment: NEGATIVE
Comment: NEGATIVE
Comment: NORMAL
Diagnosis: NEGATIVE
High risk HPV: NEGATIVE
Neisseria Gonorrhea: NEGATIVE
Trichomonas: NEGATIVE
